# Patient Record
Sex: Female | Born: 1990 | Race: Black or African American | Hispanic: No | Marital: Single | Smoking: Never smoker
Health system: Southern US, Community
[De-identification: ages and names within clinical notes are randomized; demographics above are authoritative.]

---

## 2012-02-28 ENCOUNTER — Emergency Department (HOSPITAL_COMMUNITY)
Admission: EM | Admit: 2012-02-28 | Discharge: 2012-02-28 | Disposition: A | Discharge status: Home / Self Care | Payer: Medicaid Other | Attending: Emergency Medicine | Admitting: Emergency Medicine

## 2012-02-28 ENCOUNTER — Encounter (HOSPITAL_COMMUNITY): Payer: Self-pay | Admitting: Emergency Medicine

## 2012-02-28 DIAGNOSIS — B9689 Other specified bacterial agents as the cause of diseases classified elsewhere: Secondary | ICD-10-CM | POA: Insufficient documentation

## 2012-02-28 DIAGNOSIS — N390 Urinary tract infection, site not specified: Secondary | ICD-10-CM | POA: Insufficient documentation

## 2012-02-28 DIAGNOSIS — N760 Acute vaginitis: Secondary | ICD-10-CM | POA: Insufficient documentation

## 2012-02-28 DIAGNOSIS — A499 Bacterial infection, unspecified: Secondary | ICD-10-CM | POA: Insufficient documentation

## 2012-02-28 LAB — URINALYSIS, ROUTINE W REFLEX MICROSCOPIC
Glucose, UA: NEGATIVE mg/dL
Hgb urine dipstick: NEGATIVE
Protein, ur: NEGATIVE mg/dL

## 2012-02-28 LAB — WET PREP, GENITAL: Yeast Wet Prep HPF POC: NONE SEEN

## 2012-02-28 LAB — URINE MICROSCOPIC-ADD ON

## 2012-02-28 LAB — POCT PREGNANCY, URINE: Preg Test, Ur: NEGATIVE

## 2012-02-28 MED ORDER — SULFAMETHOXAZOLE-TRIMETHOPRIM 800-160 MG PO TABS: 1.0000 | ORAL_TABLET | Freq: Two times a day (BID) | ORAL | Status: AC

## 2012-02-28 NOTE — ED Notes (Signed)
Pt c/o yellow vaginal discharge x several days with some irritation and burning

## 2012-02-28 NOTE — ED Provider Notes (Signed)
History     CSN: [id number]  Arrival date & time 02/28/12  1126   First MD Initiated Contact with Patient 02/28/12 1544      Chief Complaint  Patient presents with  . Vaginal Discharge    (Consider location/radiation/quality/duration/timing/severity/associated sxs/prior treatment) Patient is a 21 y.o. female presenting with vaginal discharge. The history is provided by the patient. No language interpreter was used.  Vaginal Discharge This is a new problem. The current episode started in the past 7 days. The problem occurs intermittently. The problem has been gradually improving. Pertinent negatives include no rash or urinary symptoms. The symptoms are aggravated by intercourse. The treatment provided significant relief.  Patient states she thought she had a yeast infection, treated with monastat with improvement in symptoms.  Persistent vaginal irritation.  Sexual active, partner uses condoms.  History reviewed. No pertinent past medical history.  History reviewed. No pertinent past surgical history.  History reviewed. No pertinent family history.  History  Substance Use Topics  . Smoking status: Never Smoker   . Smokeless tobacco: Not on file  . Alcohol Use: Yes     occ    OB History    Grav Para Term Preterm Abortions TAB SAB Ect Mult Living                  Review of Systems  Genitourinary: Positive for vaginal discharge and vaginal pain. Negative for frequency, flank pain and vaginal bleeding.  Skin: Negative for rash.  All other systems reviewed and are negative.    Allergies  Review of patient's allergies indicates no known allergies.  Home Medications   Current Outpatient Rx  Name Route Sig Dispense Refill  . DIPHENHYDRAMINE HCL 12.5 MG/5ML PO ELIX Oral Take 25 mg by mouth 4 (four) times daily as needed. allergies    . OVER THE COUNTER MEDICATION Oral Take 1 tablet by mouth 2 (two) times daily as needed. Sinus medication for sinus congestion /cold    .  SULFAMETHOXAZOLE-TRIMETHOPRIM 800-160 MG PO TABS Oral Take 1 tablet by mouth 2 (two) times daily. 14 tablet 0    BP 110/77  Pulse 86  Temp 97.8 F (36.6 C) (Oral)  Resp 16  SpO2 100%  Physical Exam  Vitals reviewed. Constitutional: She is oriented to person, place, and time. She appears well-developed and well-nourished.  HENT:  Head: Normocephalic and atraumatic.  Eyes: Pupils are equal, round, and reactive to light.  Neck: Normal range of motion.  Cardiovascular: Normal rate and regular rhythm.   No murmur heard. Pulmonary/Chest: Effort normal and breath sounds normal.  Abdominal: Soft. There is no tenderness.  Genitourinary: There is no rash or lesion on the right labia. There is no rash or lesion on the left labia. There is tenderness around the vagina. No erythema or bleeding around the vagina. No vaginal discharge found.  Musculoskeletal: Normal range of motion.  Lymphadenopathy:    She has no cervical adenopathy.  Neurological: She is alert and oriented to person, place, and time.  Skin: Skin is warm and dry. No rash noted.  Psychiatric: She has a normal mood and affect. Her behavior is normal. Judgment and thought content normal.    ED Course  Procedures (including critical care time)  Labs Reviewed  URINALYSIS, ROUTINE W REFLEX MICROSCOPIC - Abnormal; Notable for the following:    Color, Urine AMBER (*)  BIOCHEMICALS MAY BE AFFECTED BY COLOR   APPearance CLOUDY (*)     Specific Gravity, Urine 1.033 (*)  Bilirubin Urine SMALL (*)     Leukocytes, UA LARGE (*)     All other components within normal limits  URINE MICROSCOPIC-ADD ON - Abnormal; Notable for the following:    Squamous Epithelial / LPF MANY (*)     Bacteria, UA MANY (*)     Crystals CA OXALATE CRYSTALS (*)     All other components within normal limits  POCT PREGNANCY, URINE  WET PREP, GENITAL  GC/CHLAMYDIA PROBE AMP, GENITAL  URINE CULTURE   No results found.   1. Urinary tract infection     2.  Bacterial vaginosis    MDM  Wet prep with clue cells.  Patient had left prior to return of lab results.  Contacted flow manager to notify patient of findings and provided prescription for flagyl.        Jimmye Norman, NP 02/28/12 [phone]

## 2012-02-29 LAB — GC/CHLAMYDIA PROBE AMP, GENITAL
Chlamydia, DNA Probe: NEGATIVE
GC Probe Amp, Genital: NEGATIVE

## 2012-02-29 NOTE — ED Provider Notes (Signed)
Medical screening examination/treatment/procedure(s) were performed by non-physician practitioner and as supervising physician I was immediately available for consultation/collaboration.   Deetya Drouillard M Tywana Robotham, MD 02/29/12 0053 

## 2012-03-04 NOTE — ED Notes (Addendum)
Per Felicie Morn NP, call patient and inform of BV. Rx: Flagyl 2 gms po x 1. Called patient and informed them of Bacterial Vaginosis and new Rx. Wants Rx called to [facility] on [address].

## 2012-03-05 LAB — URINE CULTURE

## 2012-11-26 ENCOUNTER — Emergency Department (HOSPITAL_COMMUNITY)
Admission: EM | Admit: 2012-11-26 | Discharge: 2012-11-27 | Disposition: A | Discharge status: Home / Self Care | Payer: Medicaid Other | Attending: Emergency Medicine | Admitting: Emergency Medicine

## 2012-11-26 ENCOUNTER — Encounter (HOSPITAL_COMMUNITY): Payer: Self-pay | Admitting: *Deleted

## 2012-11-26 DIAGNOSIS — R000 Tachycardia, unspecified: Secondary | ICD-10-CM | POA: Insufficient documentation

## 2012-11-26 DIAGNOSIS — N739 Female pelvic inflammatory disease, unspecified: Secondary | ICD-10-CM | POA: Insufficient documentation

## 2012-11-26 DIAGNOSIS — R51 Headache: Secondary | ICD-10-CM | POA: Insufficient documentation

## 2012-11-26 DIAGNOSIS — R300 Dysuria: Secondary | ICD-10-CM | POA: Insufficient documentation

## 2012-11-26 DIAGNOSIS — N730 Acute parametritis and pelvic cellulitis: Secondary | ICD-10-CM

## 2012-11-26 DIAGNOSIS — R350 Frequency of micturition: Secondary | ICD-10-CM | POA: Insufficient documentation

## 2012-11-26 DIAGNOSIS — N898 Other specified noninflammatory disorders of vagina: Secondary | ICD-10-CM | POA: Insufficient documentation

## 2012-11-26 DIAGNOSIS — R197 Diarrhea, unspecified: Secondary | ICD-10-CM | POA: Insufficient documentation

## 2012-11-26 DIAGNOSIS — Z3202 Encounter for pregnancy test, result negative: Secondary | ICD-10-CM | POA: Insufficient documentation

## 2012-11-26 DIAGNOSIS — R1110 Vomiting, unspecified: Secondary | ICD-10-CM | POA: Insufficient documentation

## 2012-11-26 DIAGNOSIS — N390 Urinary tract infection, site not specified: Secondary | ICD-10-CM | POA: Insufficient documentation

## 2012-11-26 LAB — COMPREHENSIVE METABOLIC PANEL
ALT: 99 U/L — ABNORMAL HIGH (ref 0–35)
CO2: 22 mEq/L (ref 19–32)
Calcium: 9.1 mg/dL (ref 8.4–10.5)
Creatinine, Ser: 0.7 mg/dL (ref 0.50–1.10)
GFR calc Af Amer: >90 mL/min (ref >90)
GFR calc non Af Amer: >90 mL/min (ref >90)
Glucose, Bld: 164 mg/dL — ABNORMAL HIGH (ref 70–99)
Sodium: 135 mEq/L (ref 135–145)

## 2012-11-26 LAB — WET PREP, GENITAL
Clue Cells Wet Prep HPF POC: NONE SEEN
Trich, Wet Prep: NONE SEEN

## 2012-11-26 LAB — CBC WITH DIFFERENTIAL/PLATELET
Eosinophils Relative: 0 % (ref 0–5)
HCT: 38.4 % (ref 36.0–46.0)
Hemoglobin: 13.9 g/dL (ref 12.0–15.0)
Lymphocytes Relative: 10 % — ABNORMAL LOW (ref 12–46)
Lymphs Abs: 0.7 10*3/uL (ref 0.7–4.0)
MCV: 79.3 fL (ref 78.0–100.0)
Monocytes Absolute: 0.7 10*3/uL (ref 0.1–1.0)
Monocytes Relative: 9 % (ref 3–12)
RBC: 4.84 MIL/uL (ref 3.87–5.11)
WBC: 7.6 10*3/uL (ref 4.0–10.5)

## 2012-11-26 LAB — URINALYSIS, ROUTINE W REFLEX MICROSCOPIC
Glucose, UA: NEGATIVE mg/dL
Specific Gravity, Urine: 1.031 — ABNORMAL HIGH (ref 1.005–1.030)

## 2012-11-26 LAB — URINE MICROSCOPIC-ADD ON

## 2012-11-26 MED ORDER — PROMETHAZINE HCL 25 MG/ML IJ SOLN
25.0000 mg | Freq: Once | INTRAMUSCULAR | Status: DC
  Filled 2012-11-26 (×2): qty 1

## 2012-11-26 MED ORDER — DOXYCYCLINE HYCLATE 100 MG PO TABS
100.0000 mg | ORAL_TABLET | Freq: Once | ORAL | Status: AC
  Administered 2012-11-26: 100 mg via ORAL
  Filled 2012-11-26: qty 1

## 2012-11-26 MED ORDER — METRONIDAZOLE 500 MG PO TABS
500.0000 mg | ORAL_TABLET | Freq: Once | ORAL | Status: AC
  Administered 2012-11-27: 500 mg via ORAL
  Filled 2012-11-26: qty 1

## 2012-11-26 MED ORDER — SODIUM CHLORIDE 0.9 % IV BOLUS (SEPSIS)
1000.0000 mL | Freq: Once | INTRAVENOUS | Status: AC
  Administered 2012-11-26: 1000 mL via INTRAVENOUS

## 2012-11-26 MED ORDER — ACETAMINOPHEN 500 MG PO TABS
1000.0000 mg | ORAL_TABLET | Freq: Once | ORAL | Status: AC
  Administered 2012-11-26: 1000 mg via ORAL
  Filled 2012-11-26: qty 2

## 2012-11-26 MED ORDER — IOHEXOL 300 MG/ML  SOLN
50.0000 mL | Freq: Once | INTRAMUSCULAR | Status: AC | PRN
  Administered 2012-11-26: 50 mL via ORAL

## 2012-11-26 MED ORDER — CEFTRIAXONE SODIUM 1 G IJ SOLR
1.0000 g | Freq: Once | INTRAMUSCULAR | Status: AC
  Administered 2012-11-26: 1 g via INTRAMUSCULAR
  Filled 2012-11-26: qty 10

## 2012-11-26 MED ORDER — ONDANSETRON 4 MG PO TBDP
8.0000 mg | ORAL_TABLET | Freq: Once | ORAL | Status: AC
  Administered 2012-11-26: 8 mg via ORAL
  Filled 2012-11-26: qty 2

## 2012-11-26 MED ORDER — AZITHROMYCIN 250 MG PO TABS
1000.0000 mg | ORAL_TABLET | Freq: Once | ORAL | Status: AC
  Administered 2012-11-26: 1000 mg via ORAL
  Filled 2012-11-26: qty 4

## 2012-11-26 MED ORDER — LIDOCAINE HCL (PF) 1 % IJ SOLN
INTRAMUSCULAR | Status: AC
  Administered 2012-11-26: 2.1 mL
  Filled 2012-11-26: qty 5

## 2012-11-26 NOTE — ED Notes (Signed)
Pt vomited X1.  

## 2012-11-26 NOTE — ED Notes (Signed)
Pt reports that it seems like every time after she has intercourse she gets a UTI and yeast infection. Pt sts she urinates after intercourse.

## 2012-11-26 NOTE — ED Notes (Signed)
Pt given water and ginger ale, intructed to try to finish the cup of water.

## 2012-11-26 NOTE — ED Notes (Signed)
Pt is here with left lower abdominal and left flank pain for three days, yellow vaginal discharge, and eye hurts with movement

## 2012-11-26 NOTE — ED Provider Notes (Signed)
History     CSN: [id number]  Arrival date & time 11/26/12  1739   First MD Initiated Contact with Patient 11/26/12 1919      Chief Complaint  Patient presents with  . Abdominal Pain  . Flank Pain  . Vaginal Discharge    (Consider location/radiation/quality/duration/timing/severity/associated sxs/prior treatment) HPI History provided by pt.   Pt has had increased urinary frequency and dysuria x 1 week.  Today she developed yellow vaginal discharge and had a single episode of vomiting and diarrhea as well.  Has had intermittent pain in her left side and an intermittent, non-traumatic, frontal headache.  No known fever and denies having any other GU sx, hematemesis/hematochezia/melena, vision changes or dizziness.  Periods have been regular.  Single sexual partner.  No prior h/o STDs.     History reviewed. No pertinent past medical history.  History reviewed. No pertinent past surgical history.  No family history on file.  History  Substance Use Topics  . Smoking status: Never Smoker   . Smokeless tobacco: Not on file  . Alcohol Use: Yes     Comment: occ    OB History   Grav Para Term Preterm Abortions TAB SAB Ect Mult Living                  Review of Systems  All other systems reviewed and are negative.    Allergies  Review of patient's allergies indicates no known allergies.  Home Medications   Current Outpatient Rx  Name  Route  Sig  Dispense  Refill  . Cranberry-Olive Leaf (URINARY TRACT HEALTH PO)   Oral   Take 2 tablets by mouth every 6 (six) hours as needed (for urinary tract pain).         . medroxyPROGESTERone (DEPO-PROVERA) 150 MG/ML injection   Intramuscular   Inject 150 mg into the muscle every 3 (three) months.           BP 117/70  Pulse 110  Temp(Src) 99.9 F (37.7 C) (Oral)  Resp 20  SpO2 99%  Physical Exam  Nursing note and vitals reviewed. Constitutional: She is oriented to person, place, and time. She appears well-developed and  well-nourished. No distress.  HENT:  Head: Normocephalic and atraumatic.  Eyes:  Normal appearance  Neck: Normal range of motion.  No meningeal signs  Cardiovascular: Regular rhythm.   tachycardic  Pulmonary/Chest: Effort normal and breath sounds normal. No respiratory distress.  Abdominal: Soft. Bowel sounds are normal. She exhibits no distension and no mass. There is no rebound and no guarding.  Mild suprapubic and diffuse lower abdominal ttp.   Genitourinary:  No CVA tenderness.  Nml external genitalia.  Large amt thin, greenish-yellow vaginal discharge.  Cervix closed, erythematous, friable.   Diffuse, severe tenderness on bimanual.   Musculoskeletal: Normal range of motion.  Neurological: She is alert and oriented to person, place, and time.  CN 3-12 intact.  No sensory deficits.  5/5 and equal upper and lower extremity strength.  No past pointing.   Skin: Skin is warm and dry. No rash noted.  Psychiatric: She has a normal mood and affect. Her behavior is normal.    ED Course  Procedures (including critical care time)  Labs Reviewed  WET PREP, GENITAL - Abnormal; Notable for the following:    WBC, Wet Prep HPF POC MANY (*)    All other components within normal limits  URINALYSIS, ROUTINE W REFLEX MICROSCOPIC - Abnormal; Notable for the following:  Color, Urine AMBER (*)    APPearance CLOUDY (*)    Specific Gravity, Urine 1.031 (*)    Hgb urine dipstick MODERATE (*)    Ketones, ur 15 (*)    Protein, ur 30 (*)    Nitrite POSITIVE (*)    Leukocytes, UA LARGE (*)    All other components within normal limits  CBC WITH DIFFERENTIAL - Abnormal; Notable for the following:    MCHC 36.2 (*)    Neutrophils Relative % 81 (*)    Lymphocytes Relative 10 (*)    All other components within normal limits  COMPREHENSIVE METABOLIC PANEL - Abnormal; Notable for the following:    Glucose, Bld 164 (*)    AST 100 (*)    ALT 99 (*)    Total Bilirubin 2.2 (*)    All other components  within normal limits  URINE MICROSCOPIC-ADD ON - Abnormal; Notable for the following:    Squamous Epithelial / LPF FEW (*)    Bacteria, UA FEW (*)    All other components within normal limits  URINE CULTURE  GC/CHLAMYDIA PROBE AMP  POCT PREGNANCY, URINE   Ct Abdomen Pelvis W Contrast  11/27/2012   *RADIOLOGY REPORT*  Clinical Data: Flank pain.  Vaginal discharge.  Evaluate for possible tubo-ovarian abscess.  CT ABDOMEN AND PELVIS WITH CONTRAST  Technique:  Multidetector CT imaging of the abdomen and pelvis was performed following the standard protocol during bolus administration of intravenous contrast.  Contrast:  100 ml of Omnipaque-300.  Comparison: No priors.  Findings:  Lung Bases: Unremarkable.  Abdomen/Pelvis:  The appearance of the liver, gallbladder, pancreas, spleen, bilateral adrenal glands and bilateral kidneys is unremarkable.  Normal appendix.  The uterus and ovaries are unremarkable in appearance.  Specifically, no findings to strongly suggest the presence of a tubo-ovarian abscess at this time.  No significant volume of ascites.  No pneumoperitoneum.  No pathologic distension of small bowel.  No definite pathologic lymphadenopathy identified within the abdomen or pelvis.  Urinary bladder is unremarkable in appearance.  Musculoskeletal: There are no aggressive appearing lytic or blastic lesions noted in the visualized portions of the skeleton.  IMPRESSION: 1.  No acute findings in the abdomen or pelvis to account for the patient's symptoms.  Specifically, no signs of a tubal ovarian abscess are identified at this time. 2.  Normal appendix.   Original Report Authenticated By: Trudie Reed, M.D.     1. PID (acute pelvic inflammatory disease)   2. UTI (lower urinary tract infection)       MDM  22yo healthy F presents w/ abd pain, dysuria, frequency and vaginal discharge.  Non-toxic appearing, borderline febrile, tachycardic, lower abd tenderness, green-yellow vaginal discharge +  friable cervix and diffuse, severe tenderness on bimanual exam.  Labs sig for UTI.  Pt likely has PID as well.  She has received IM rocephin, po zithromax, doxy and flagyl.   She has also had tylenol and zofran.  She is not tolerating fluids well because she "has a headache" and continues to be tachycardic, most recent HR 130.  Normotensive.  Discussed w/ Dr. Patria Mane who recommends CT abd/pelvis.  IVF and phenergan ordered.  Will reassess shortly.  11:17 PM   Vital signs have normalized and pt otherwise stable.  CT unremarkable.  Results discussed w/ patient.  D/c'd home w/ doxycycline, flagyl, keflex, vicodin and phenergan.  Referred to [facility]  Return precautions discussed.         Otilio Miu, PA-C  11/27/12 0613 

## 2012-11-26 NOTE — ED Notes (Signed)
Pt c/o UTI symptoms, sts she took some OTC meds and took it yesterday unsure what it was called. Pt reports she is very sore in vaginal area, has burning and itching. Pt reports seeing yellow vaginal d/c tried using monostat but still having d/c. Pt denies vaginal odor. Pt denies using protection with intercourse. Pt in nad, skin warm and dry, resp e/u.

## 2012-11-27 ENCOUNTER — Emergency Department (HOSPITAL_COMMUNITY): Payer: Medicaid Other

## 2012-11-27 MED ORDER — HYDROCODONE-ACETAMINOPHEN 5-325 MG PO TABS: 1.0000 | ORAL_TABLET | ORAL | Status: DC | PRN

## 2012-11-27 MED ORDER — METRONIDAZOLE 500 MG PO TABS: 500.0000 mg | ORAL_TABLET | Freq: Two times a day (BID) | ORAL | Status: DC

## 2012-11-27 MED ORDER — PROMETHAZINE HCL 25 MG PO TABS: 25.0000 mg | ORAL_TABLET | Freq: Four times a day (QID) | ORAL | Status: DC | PRN

## 2012-11-27 MED ORDER — PHENAZOPYRIDINE HCL 100 MG PO TABS
95.0000 mg | ORAL_TABLET | Freq: Once | ORAL | Status: AC
  Administered 2012-11-27: 100 mg via ORAL
  Filled 2012-11-27: qty 1

## 2012-11-27 MED ORDER — CEPHALEXIN 500 MG PO CAPS: 1000.0000 mg | ORAL_CAPSULE | Freq: Two times a day (BID) | ORAL | Status: DC

## 2012-11-27 MED ORDER — IOHEXOL 300 MG/ML  SOLN
100.0000 mL | Freq: Once | INTRAMUSCULAR | Status: AC | PRN
  Administered 2012-11-27: 100 mL via INTRAVENOUS

## 2012-11-27 MED ORDER — DOXYCYCLINE HYCLATE 100 MG PO CAPS: 100.0000 mg | ORAL_CAPSULE | Freq: Two times a day (BID) | ORAL | Status: DC

## 2012-11-27 NOTE — ED Notes (Signed)
Pt states she can not drink anymore contrast until she has something for her burning urination. RN informed pt that he would speak to her provider, and RN reinforced the importance of drinking the oral contrast for CT scan.

## 2012-11-27 NOTE — ED Notes (Signed)
CT paged. 

## 2012-11-27 NOTE — ED Notes (Signed)
Pt states she wishes to wait on taking the phenergan since she is not nauseous now.

## 2012-11-28 NOTE — ED Provider Notes (Signed)
Medical screening examination/treatment/procedure(s) were performed by non-physician practitioner and as supervising physician I was immediately available for consultation/collaboration.   Lyanne Co, MD 11/28/12 [phone]

## 2012-11-30 ENCOUNTER — Emergency Department (HOSPITAL_COMMUNITY)
Admission: EM | Admit: 2012-11-30 | Discharge: 2012-11-30 | Disposition: A | Discharge status: Home / Self Care | Payer: Medicaid Other | Attending: Emergency Medicine | Admitting: Emergency Medicine

## 2012-11-30 DIAGNOSIS — N342 Other urethritis: Secondary | ICD-10-CM | POA: Insufficient documentation

## 2012-11-30 DIAGNOSIS — R350 Frequency of micturition: Secondary | ICD-10-CM | POA: Insufficient documentation

## 2012-11-30 DIAGNOSIS — R109 Unspecified abdominal pain: Secondary | ICD-10-CM | POA: Insufficient documentation

## 2012-11-30 DIAGNOSIS — N898 Other specified noninflammatory disorders of vagina: Secondary | ICD-10-CM | POA: Insufficient documentation

## 2012-11-30 LAB — URINE CULTURE: Colony Count: 35000

## 2012-11-30 LAB — CBC
MCV: 79.4 fL (ref 78.0–100.0)
Platelets: 154 10*3/uL (ref 150–400)
RDW: 12.1 % (ref 11.5–15.5)
WBC: 4.2 10*3/uL (ref 4.0–10.5)

## 2012-11-30 LAB — URINE MICROSCOPIC-ADD ON

## 2012-11-30 LAB — URINALYSIS, ROUTINE W REFLEX MICROSCOPIC
Hgb urine dipstick: NEGATIVE
Nitrite: POSITIVE — AB
Protein, ur: 30 mg/dL — AB
Urobilinogen, UA: 0.2 mg/dL (ref 0.0–1.0)

## 2012-11-30 LAB — POCT I-STAT, CHEM 8
Calcium, Ion: 1.11 mmol/L — ABNORMAL LOW (ref 1.12–1.23)
Chloride: 102 mEq/L (ref 96–112)
HCT: 39 % (ref 36.0–46.0)
Sodium: 137 mEq/L (ref 135–145)
TCO2: 25 mmol/L (ref 0–100)

## 2012-11-30 MED ORDER — KETOROLAC TROMETHAMINE 30 MG/ML IJ SOLN
30.0000 mg | Freq: Once | INTRAMUSCULAR | Status: AC
  Administered 2012-11-30: 30 mg via INTRAVENOUS
  Filled 2012-11-30: qty 1

## 2012-11-30 MED ORDER — PHENAZOPYRIDINE HCL 200 MG PO TABS: 200.0000 mg | ORAL_TABLET | Freq: Three times a day (TID) | ORAL | Status: DC

## 2012-11-30 MED ORDER — HYDROCODONE-ACETAMINOPHEN 5-325 MG PO TABS: 2.0000 | ORAL_TABLET | ORAL | Status: DC | PRN

## 2012-11-30 MED ORDER — ACETAMINOPHEN 325 MG PO TABS: 650.0000 mg | ORAL_TABLET | Freq: Once | ORAL | Status: AC

## 2012-11-30 MED ORDER — MORPHINE SULFATE 4 MG/ML IJ SOLN
4.0000 mg | Freq: Once | INTRAMUSCULAR | Status: AC
  Administered 2012-11-30: 4 mg via INTRAVENOUS
  Filled 2012-11-30: qty 1

## 2012-11-30 MED ORDER — PHENAZOPYRIDINE HCL 100 MG PO TABS
95.0000 mg | ORAL_TABLET | Freq: Once | ORAL | Status: AC
  Administered 2012-11-30: 100 mg via ORAL
  Filled 2012-11-30: qty 1

## 2012-11-30 MED ORDER — SODIUM CHLORIDE 0.9 % IV BOLUS (SEPSIS)
1000.0000 mL | Freq: Once | INTRAVENOUS | Status: AC
  Administered 2012-11-30: 1000 mL via INTRAVENOUS

## 2012-11-30 MED ORDER — ACYCLOVIR 400 MG PO TABS: 800.0000 mg | ORAL_TABLET | Freq: Every day | ORAL | Status: DC

## 2012-11-30 MED ORDER — ACETAMINOPHEN 325 MG PO TABS
650.0000 mg | ORAL_TABLET | Freq: Once | ORAL | Status: AC
  Administered 2012-11-30: 650 mg via ORAL
  Filled 2012-11-30: qty 2

## 2012-11-30 NOTE — ED Provider Notes (Signed)
Medical screening examination/treatment/procedure(s) were performed by non-physician practitioner and as supervising physician I was immediately available for consultation/collaboration.  Anaisabel Pederson, MD 11/30/12 2135 

## 2012-11-30 NOTE — ED Provider Notes (Signed)
History     CSN: [id number]  Arrival date & time 11/30/12  0204   First MD Initiated Contact with Patient 11/30/12 0406      No chief complaint on file.   (Consider location/radiation/quality/duration/timing/severity/associated sxs/prior treatment) HPI History provided by pt.   Pt presents to ED for second time in 3 days w/ dysuria, frequency, yellow vaginal discharge and diffuse lower abd pain.  Was diagnosed w/ clinical PID as well as UTI at last visit, treated in ED w/ zithromax and rocephin, and d/c'd home w/ doxy, flagyl, keflex, vicodin and zofran.  Has been taking all as prescribed, but symptoms have worsened.  Abd pain has increased and is now radiating around to lower back.  She has lost her appetite and feels generally weak. She tries to avoid urinating because the burning is so severe.  No known fever.   No past medical history on file.  No past surgical history on file.  No family history on file.  History  Substance Use Topics  . Smoking status: Never Smoker   . Smokeless tobacco: Not on file  . Alcohol Use: Yes     Comment: occ    OB History   Grav Para Term Preterm Abortions TAB SAB Ect Mult Living                  Review of Systems  All other systems reviewed and are negative.    Allergies  Review of patient's allergies indicates no known allergies.  Home Medications   Current Outpatient Rx  Name  Route  Sig  Dispense  Refill  . cephALEXin (KEFLEX) 500 MG capsule   Oral   Take 2 capsules (1,000 mg total) by mouth 2 (two) times daily.   28 capsule   0   . dimenhyDRINATE (DRAMAMINE) 50 MG tablet   Oral   Take 100 mg by mouth every 8 (eight) hours as needed (nausea).         . doxycycline (VIBRAMYCIN) 100 MG capsule   Oral   Take 1 capsule (100 mg total) by mouth 2 (two) times daily.   19 capsule   0   . HYDROcodone-acetaminophen (NORCO/VICODIN) 5-325 MG per tablet   Oral   Take 1 tablet by mouth every 4 (four) hours as needed for pain.   20 tablet   0   . medroxyPROGESTERone (DEPO-PROVERA) 150 MG/ML injection   Intramuscular   Inject 150 mg into the muscle every 3 (three) months.         . metroNIDAZOLE (FLAGYL) 500 MG tablet   Oral   Take 1 tablet (500 mg total) by mouth 2 (two) times daily.   14 tablet   0     BP 116/79  Pulse 110  Temp(Src) 101.6 F (38.7 C) (Oral)  Resp 16  SpO2 95%  LMP 11/30/2012  Physical Exam  Nursing note and vitals reviewed. Constitutional: She is oriented to person, place, and time. She appears well-developed and well-nourished. No distress.  HENT:  Head: Normocephalic and atraumatic.  Eyes:  Normal appearance  Neck: Normal range of motion.  Cardiovascular: Regular rhythm.   tachycardic  Pulmonary/Chest: Effort normal and breath sounds normal. No respiratory distress.  Abdominal: Soft. Bowel sounds are normal. She exhibits no distension and no mass. There is no rebound and no guarding.  abd diffusely tender, but worst mid-line lower abd and LLQ  Genitourinary:  No CVA tenderness.  Pt refuses repeat pelvic examination.  Musculoskeletal: Normal range of  motion.  Neurological: She is alert and oriented to person, place, and time.  Skin: Skin is warm and dry. No rash noted.  Psychiatric: She has a normal mood and affect. Her behavior is normal.    ED Course  Procedures (including critical care time)  Labs Reviewed  URINALYSIS, ROUTINE W REFLEX MICROSCOPIC - Abnormal; Notable for the following:    Color, Urine AMBER (*)    APPearance CLOUDY (*)    Specific Gravity, Urine 1.031 (*)    Glucose, UA 100 (*)    Ketones, ur 40 (*)    Protein, ur 30 (*)    Nitrite POSITIVE (*)    Leukocytes, UA MODERATE (*)    All other components within normal limits  CBC - Abnormal; Notable for the following:    MCHC 36.8 (*)    All other components within normal limits  URINE MICROSCOPIC-ADD ON - Abnormal; Notable for the following:    Squamous Epithelial / LPF MANY (*)    Bacteria,  UA FEW (*)    All other components within normal limits  POCT I-STAT, CHEM 8 - Abnormal; Notable for the following:    Glucose, Bld 163 (*)    Calcium, Ion 1.11 (*)    All other components within normal limits  URINE CULTURE   No results found.   No diagnosis found.    MDM  22yo formerly healthy F, diagnosed w/ clinical PID and UTI in ED 3 days ago, returns to ER with worsening sx, despite compliance w/ abx.  On exam, febrile, tachycardic, abd soft/non-distended, diffuse ttp, particularly mid-line lower abd and LLQ.  Pt refuses repeat pelvic examination.  Labs unremarkable w/ exception of U/A which shows persistent infection.  She is receiving IVF.  Morphine ordered for pain and tylenol for fever.  Dr. Hyacinth Meeker to resume care.  7:10 AM         Otilio Miu, PA-C 11/30/12 [phone]

## 2012-11-30 NOTE — ED Notes (Signed)
Here on 11/26/12 for pid.  Prescribed flagyl, doxycycline, and Vicodin. Nothing is working. No appetite.  Increase pain with urination.

## 2012-11-30 NOTE — ED Provider Notes (Signed)
22 year old female who presents with approximately one week of ongoing symptoms including vaginal discharge, dysuria with urination which is described as a burning, has been fairly consistent over the last week, nothing seems to make it better or worse and is associated with mild fevers and vaginal discharge.  Labs show that the patient had 35,000 colony-forming units which were grown as 2 different strains, E. coli and Proteus. It is sensitive to Keflex which the patient has been taking. Gonorrhea and Chlamydia tested negative.  On my exam the patient has several discrete erythematous and early ulcerative lesions around the introitus and around the urethra which could be consistent with herpes. I have treated her with acyclovir, she has refused an internal exam, she states her understanding that I cannot give her a full diagnosis without a full exam but is willing to followup as an outpatient.  At this time I do not feel that the patient requires admission to the hospital, reviewed the CT scan shows no signs of intraoral 3 times a day, tubo-ovarian abscess or renal abscess, there is no leukocytosis and her fever has defervesced. She is not tachycardic, she is well appearing, she is taking oral fluids and will be treated with multiple medications for symptomatically and followup with the STD clinic.   Meds given in ED:  Medications  acetaminophen (TYLENOL) tablet 650 mg (0 mg Oral Duplicate 11/30/12 0506)  acetaminophen (TYLENOL) tablet 650 mg (650 mg Oral Given 11/30/12 0522)  morphine 4 MG/ML injection 4 mg (4 mg Intravenous Given 11/30/12 0524)  sodium chloride 0.9 % bolus 1,000 mL (0 mLs Intravenous Stopped 11/30/12 0625)  ketorolac (TORADOL) 30 MG/ML injection 30 mg (30 mg Intravenous Given 11/30/12 0525)  morphine 4 MG/ML injection 4 mg (4 mg Intravenous Given 11/30/12 0732)  phenazopyridine (PYRIDIUM) tablet 100 mg (100 mg Oral Given 11/30/12 0732)    New Prescriptions   ACYCLOVIR (ZOVIRAX) 400 MG  TABLET    Take 2 tablets (800 mg total) by mouth 5 (five) times daily.   HYDROCODONE-ACETAMINOPHEN (NORCO/VICODIN) 5-325 MG PER TABLET    Take 2 tablets by mouth every 4 (four) hours as needed for pain.   PHENAZOPYRIDINE (PYRIDIUM) 200 MG TABLET    Take 1 tablet (200 mg total) by mouth 3 (three) times daily.     Medical screening examination/treatment/procedure(s) were conducted as a shared visit with non-physician practitioner(s) and myself.  I personally evaluated the patient during the encounter    Vida Roller, MD 11/30/12 [phone]

## 2012-12-01 ENCOUNTER — Telehealth (HOSPITAL_COMMUNITY): Payer: Self-pay | Admitting: Emergency Medicine

## 2012-12-01 LAB — URINE CULTURE: Colony Count: NO GROWTH

## 2012-12-01 NOTE — ED Notes (Signed)
Post ED Visit - Positive Culture Follow-up  Culture report reviewed by antimicrobial stewardship pharmacist: [x]  Wes Dulaney, Pharm.D., BCPS []  Celedonio Miyamoto, Pharm.D., BCPS []  Georgina Pillion, [address].D., BCPS []  [place], [address].D., BCPS, AAHIVP []  Estella Husk, Pharm.D., BCPS, AAHIVP  Positive urine culture Treated with Keflex, organism sensitive to the same and no further patient follow-up is required at this time.  Kylie A Holland 12/01/2012, 4:21 PM

## 2012-12-02 ENCOUNTER — Other Ambulatory Visit: Payer: Self-pay | Admitting: Medical

## 2012-12-02 ENCOUNTER — Encounter (HOSPITAL_COMMUNITY): Payer: Self-pay

## 2012-12-02 ENCOUNTER — Inpatient Hospital Stay (HOSPITAL_COMMUNITY)
Admission: AD | Admit: 2012-12-02 | Discharge: 2012-12-02 | Disposition: A | Discharge status: Home / Self Care | Payer: Medicaid Other | Source: Ambulatory Visit | Attending: Obstetrics & Gynecology | Admitting: Obstetrics & Gynecology

## 2012-12-02 ENCOUNTER — Emergency Department (HOSPITAL_COMMUNITY)
Admission: EM | Admit: 2012-12-02 | Discharge: 2012-12-02 | Disposition: A | Discharge status: Home / Self Care | Payer: Medicaid Other | Source: Home / Self Care | Attending: Emergency Medicine | Admitting: Emergency Medicine

## 2012-12-02 DIAGNOSIS — N7689 Other specified inflammation of vagina and vulva: Secondary | ICD-10-CM | POA: Insufficient documentation

## 2012-12-02 DIAGNOSIS — N9489 Other specified conditions associated with female genital organs and menstrual cycle: Secondary | ICD-10-CM | POA: Insufficient documentation

## 2012-12-02 DIAGNOSIS — N766 Ulceration of vulva: Secondary | ICD-10-CM

## 2012-12-02 DIAGNOSIS — A6000 Herpesviral infection of urogenital system, unspecified: Secondary | ICD-10-CM | POA: Insufficient documentation

## 2012-12-02 DIAGNOSIS — R300 Dysuria: Secondary | ICD-10-CM | POA: Insufficient documentation

## 2012-12-02 DIAGNOSIS — A6009 Herpesviral infection of other urogenital tract: Secondary | ICD-10-CM

## 2012-12-02 DIAGNOSIS — Z79899 Other long term (current) drug therapy: Secondary | ICD-10-CM | POA: Insufficient documentation

## 2012-12-02 DIAGNOSIS — N949 Unspecified condition associated with female genital organs and menstrual cycle: Secondary | ICD-10-CM | POA: Insufficient documentation

## 2012-12-02 DIAGNOSIS — N72 Inflammatory disease of cervix uteri: Secondary | ICD-10-CM | POA: Insufficient documentation

## 2012-12-02 DIAGNOSIS — N898 Other specified noninflammatory disorders of vagina: Secondary | ICD-10-CM

## 2012-12-02 LAB — WET PREP, GENITAL
Clue Cells Wet Prep HPF POC: NONE SEEN
Yeast Wet Prep HPF POC: NONE SEEN

## 2012-12-02 LAB — RPR: RPR Ser Ql: NONREACTIVE

## 2012-12-02 MED ORDER — LIDOCAINE HCL 2 % EX GEL
Freq: Once | CUTANEOUS | Status: AC
  Administered 2012-12-02: 5 via TOPICAL
  Filled 2012-12-02: qty 20

## 2012-12-02 MED ORDER — LIDOCAINE HCL 2 % EX GEL: CUTANEOUS | Status: DC | PRN

## 2012-12-02 NOTE — MAU Provider Note (Signed)
History     CSN: [id number]  Arrival date and time: 12/02/12 6578   None     No chief complaint on file.  HPI  Pt arrived here after being seen at the [facility] ED with the following HPI:  This is a 22 year old female with her third ED visit for pelvic pain in a week. She was seen on June 2 and diagnosed with PID. She was treated with Flagyl, Rocephin, doxycycline, Phenergan and Norco. She was also noted to have a urinary tract infection and was treated with Keflex. A CT scan at that time was unremarkable.  She was seen again on June 6 as though she refused a full pelvic exam and external exam showed vulvar lesions suspicious for herpes. She was treated with Zovirax and it was recommended she followup as an outpatient.  She is here complaining of severe pain in her vulva. She states the pain is external not deep in her pelvis. It is exacerbated by urinating, walking or making any movement that causes pressure on her vulva. There is associated purulent discharge. She is no longer having a fever. She has not had any vaginal bleeding. She has noticed some blood with urination. None of the medications she is taking has helped.   Pt denies that the lesions started with any tingling or small blisters.  No known history of herpes in the past.  Pt sent here for evaluation of lesions due to ulcers not appearing as typical herpetic lesions.     No past medical history on file.  No past surgical history on file.  No family history on file.  History  Substance Use Topics  . Smoking status: Never Smoker   . Smokeless tobacco: Not on file  . Alcohol Use: Yes     Comment: occ    Allergies: No Known Allergies  Prescriptions prior to admission  Medication Sig Dispense Refill  . acyclovir (ZOVIRAX) 400 MG tablet Take 2 tablets (800 mg total) by mouth 5 (five) times daily.  50 tablet  0  . cephALEXin (KEFLEX) 500 MG capsule Take 2 capsules (1,000 mg total) by mouth 2 (two) times daily.  28 capsule   0  . dimenhyDRINATE (DRAMAMINE) 50 MG tablet Take 100 mg by mouth every 8 (eight) hours as needed (nausea).      . doxycycline (VIBRAMYCIN) 100 MG capsule Take 1 capsule (100 mg total) by mouth 2 (two) times daily.  19 capsule  0  . HYDROcodone-acetaminophen (NORCO/VICODIN) 5-325 MG per tablet Take 2 tablets by mouth every 4 (four) hours as needed for pain.  10 tablet  0  . metroNIDAZOLE (FLAGYL) 500 MG tablet Take 1 tablet (500 mg total) by mouth 2 (two) times daily.  14 tablet  0  . phenazopyridine (PYRIDIUM) 200 MG tablet Take 1 tablet (200 mg total) by mouth 3 (three) times daily.  6 tablet  0  . medroxyPROGESTERone (DEPO-PROVERA) 150 MG/ML injection Inject 150 mg into the muscle every 3 (three) months.        Review of Systems  Genitourinary: Positive for dysuria.       Vaginal lesions and pain  All other systems reviewed and are negative.   Physical Exam   Blood pressure 109/80, pulse 100, temperature 98.2 F (36.8 C), resp. rate 18, height 5\' 6"  (1.676 m), weight 56.246 kg (124 lb), last menstrual period 11/30/2012.  Physical Exam  Constitutional: She is oriented to person, place, and time. She appears well-developed and well-nourished. No distress.  HENT:  Head: Normocephalic.  Neck: Normal range of motion. Neck supple.  GI: Soft. There is no tenderness.  Genitourinary:    There is tenderness around the vagina.  Musculoskeletal: Normal range of motion.  Neurological: She is alert and oriented to person, place, and time. She has normal reflexes.  Skin: Skin is warm and dry.    MAU Course  Procedures Results for orders placed during the hospital encounter of 12/02/12 (from the past 24 hour(s))  WET PREP, GENITAL     Status: Abnormal   Collection Time    12/02/12  6:58 AM      Result Value Range   Yeast Wet Prep HPF POC NONE SEEN  NONE SEEN   Trich, Wet Prep NONE SEEN  NONE SEEN   Clue Cells Wet Prep HPF POC NONE SEEN  NONE SEEN   WBC, Wet Prep HPF POC FEW (*) NONE  SEEN    HSV I&II serum test obtained as well as culture. RPR testing  Dr. 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

## 2012-12-02 NOTE — MAU Note (Signed)
Sent from [facility] for further evaluation of vaginal irritation.

## 2012-12-02 NOTE — Progress Notes (Signed)
Patient called requesting Rx for lidocaine gel. She was given sample in MAU this morning. Rx sent to patient's pharmacy with 2 refills.   Freddi Starr, PA-C 12/02/2012 5:33 PM

## 2012-12-02 NOTE — ED Provider Notes (Signed)
History     CSN: [id number]  Arrival date & time 12/02/12  0424   First MD Initiated Contact with Patient 12/02/12 0500      Chief Complaint  Patient presents with  . Pelvic Pain    (Consider location/radiation/quality/duration/timing/severity/associated sxs/prior treatment) HPI This is a 22 year old female with her third ED visit for pelvic pain in a week. She was seen on June 2 and diagnosed with PID. She was treated with Flagyl, Rocephin, doxycycline, Phenergan and Norco. She was also noted to have a urinary tract infection and was treated with Keflex. A CT scan at that time was unremarkable.  She was seen again on June 6 as though she refused a full pelvic exam and external exam showed vulvar lesions suspicious for herpes. She was treated with Zovirax and it was recommended she followup as an outpatient.  She is here complaining of severe pain in her vulva. She states the pain is external not deep in her pelvis. It is exacerbated by urinating, walking or making any movement that causes pressure on her vulva. There is associated purulent discharge. She is no longer having a fever. She has not had any vaginal bleeding. She has noticed some blood with urination. None of the medications she is taking has helped.  History reviewed. No pertinent past medical history.  History reviewed. No pertinent past surgical history.  No family history on file.  History  Substance Use Topics  . Smoking status: Never Smoker   . Smokeless tobacco: Not on file  . Alcohol Use: Yes     Comment: occ    OB History   Grav Para Term Preterm Abortions TAB SAB Ect Mult Living                  Review of Systems  All other systems reviewed and are negative.    Allergies  Review of patient's allergies indicates no known allergies.  Home Medications   Current Outpatient Rx  Name  Route  Sig  Dispense  Refill  . acyclovir (ZOVIRAX) 400 MG tablet   Oral   Take 2 tablets (800 mg total) by mouth  5 (five) times daily.   50 tablet   0   . cephALEXin (KEFLEX) 500 MG capsule   Oral   Take 2 capsules (1,000 mg total) by mouth 2 (two) times daily.   28 capsule   0   . dimenhyDRINATE (DRAMAMINE) 50 MG tablet   Oral   Take 100 mg by mouth every 8 (eight) hours as needed (nausea).         . doxycycline (VIBRAMYCIN) 100 MG capsule   Oral   Take 1 capsule (100 mg total) by mouth 2 (two) times daily.   19 capsule   0   . HYDROcodone-acetaminophen (NORCO/VICODIN) 5-325 MG per tablet   Oral   Take 2 tablets by mouth every 4 (four) hours as needed for pain.   10 tablet   0   . medroxyPROGESTERone (DEPO-PROVERA) 150 MG/ML injection   Intramuscular   Inject 150 mg into the muscle every 3 (three) months.         . metroNIDAZOLE (FLAGYL) 500 MG tablet   Oral   Take 1 tablet (500 mg total) by mouth 2 (two) times daily.   14 tablet   0   . phenazopyridine (PYRIDIUM) 200 MG tablet   Oral   Take 1 tablet (200 mg total) by mouth 3 (three) times daily.   6 tablet  0     BP 119/81  Pulse 106  Temp(Src) 98.7 F (37.1 C) (Oral)  Resp 20  Ht 5\' 6"  (1.676 m)  Wt 125 lb (56.7 kg)  BMI 20.19 kg/m2  SpO2 98%  LMP 11/30/2012  Physical Exam General: Well-developed, well-nourished female in no acute distress; appearance consistent with age of record HENT: normocephalic, atraumatic Eyes: pupils equal round and reactive to light; extraocular muscles intact Neck: supple Heart: regular rate and rhythm Lungs: clear to auscultation bilaterally Abdomen: soft; nondistended; mild suprapubic tenderness; no masses or hepatosplenomegaly; bowel sounds present GU: Multiple, almost contiguous, ulcerations of the vulvar mucosa with mucopurulent discharge and significant tenderness; patient unable to tolerate it internal exam. Extremities: No deformity; full range of motion; pulses normal Neurologic: Awake, alert and oriented; motor function intact in all extremities and symmetric; no  facial droop Skin: Warm and dry Psychiatric: Normal mood and affect     ED Course  Procedures (including critical care time)     MDM  D/W Sherryle Lis Muhummed, CNW at [facility] MAU, who will see the patient this morning.        Hanley Seamen, MD 12/02/12 [phone]

## 2012-12-02 NOTE — ED Notes (Signed)
Pt to ed c/o pelvic pain.  Has been seen here x 3 this week for same.  Was diag with PID and dcd home to f/u with obgyn but sts she has not done so.  C/o that the meds she was given here are not helping her.

## 2012-12-02 NOTE — ED Notes (Signed)
The patient is AOx4 and comfortable with her discharge instructions. 

## 2012-12-05 LAB — HERPES SIMPLEX VIRUS CULTURE

## 2013-03-15 ENCOUNTER — Emergency Department (HOSPITAL_COMMUNITY): Payer: Medicaid Other

## 2013-03-15 ENCOUNTER — Encounter (HOSPITAL_COMMUNITY): Payer: Self-pay | Admitting: Emergency Medicine

## 2013-03-15 ENCOUNTER — Emergency Department (HOSPITAL_COMMUNITY)
Admission: EM | Admit: 2013-03-15 | Discharge: 2013-03-15 | Disposition: A | Discharge status: Home / Self Care | Payer: Medicaid Other | Attending: Emergency Medicine | Admitting: Emergency Medicine

## 2013-03-15 DIAGNOSIS — R1084 Generalized abdominal pain: Secondary | ICD-10-CM | POA: Insufficient documentation

## 2013-03-15 DIAGNOSIS — R112 Nausea with vomiting, unspecified: Secondary | ICD-10-CM | POA: Insufficient documentation

## 2013-03-15 DIAGNOSIS — R109 Unspecified abdominal pain: Secondary | ICD-10-CM

## 2013-03-15 DIAGNOSIS — K5900 Constipation, unspecified: Secondary | ICD-10-CM | POA: Insufficient documentation

## 2013-03-15 DIAGNOSIS — Z3202 Encounter for pregnancy test, result negative: Secondary | ICD-10-CM | POA: Insufficient documentation

## 2013-03-15 LAB — COMPREHENSIVE METABOLIC PANEL
ALT: 43 U/L — ABNORMAL HIGH (ref 0–35)
AST: 41 U/L — ABNORMAL HIGH (ref 0–37)
Albumin: 4 g/dL (ref 3.5–5.2)
CO2: 26 mEq/L (ref 19–32)
Chloride: 101 mEq/L (ref 96–112)
Creatinine, Ser: 0.75 mg/dL (ref 0.50–1.10)
GFR calc non Af Amer: >90 mL/min (ref >90)
Potassium: 3.9 mEq/L (ref 3.5–5.1)
Sodium: 136 mEq/L (ref 135–145)
Total Bilirubin: 1.3 mg/dL — ABNORMAL HIGH (ref 0.3–1.2)

## 2013-03-15 LAB — CBC
Platelets: 207 10*3/uL (ref 150–400)
RBC: 4.98 MIL/uL (ref 3.87–5.11)
RDW: 12.1 % (ref 11.5–15.5)
WBC: 4.7 10*3/uL (ref 4.0–10.5)

## 2013-03-15 LAB — HCG, SERUM, QUALITATIVE: Preg, Serum: NEGATIVE

## 2013-03-15 LAB — URINALYSIS, ROUTINE W REFLEX MICROSCOPIC
Bilirubin Urine: NEGATIVE
Nitrite: NEGATIVE
Specific Gravity, Urine: 1.025 (ref 1.005–1.030)
Urobilinogen, UA: 1 mg/dL (ref 0.0–1.0)
pH: 6.5 (ref 5.0–8.0)

## 2013-03-15 LAB — PREGNANCY, URINE: Preg Test, Ur: NEGATIVE

## 2013-03-15 LAB — URINE MICROSCOPIC-ADD ON

## 2013-03-15 MED ORDER — DOCUSATE SODIUM 100 MG PO CAPS: 100.0000 mg | ORAL_CAPSULE | Freq: Two times a day (BID) | ORAL | Status: DC

## 2013-03-15 MED ORDER — ONDANSETRON 8 MG PO TBDP: 8.0000 mg | ORAL_TABLET | Freq: Three times a day (TID) | ORAL | Status: DC | PRN

## 2013-03-15 NOTE — ED Provider Notes (Signed)
CSN: [id number]     Arrival date & time 03/15/13  1249 History   First MD Initiated Contact with Patient 03/15/13 1258     Chief Complaint  Patient presents with  . Abdominal Pain   HPI Patient complains of intermittent abdominal pain that lasts 5 or 6 minutes followed by nausea with occasional episodes of vomiting.  This is been occurring over the past 10-14 days.  She also reports some constipation over the past week.  She states she felt like she had several large bowel movements yesterday which have improved her symptoms.  She denies urinary symptoms.  She has abnormal vaginal bleeding at baseline.  She denies any hematemesis.  No melena or hematochezia. Her symptoms are mild in severity.  Currently she is without any pain.  She reports her pain is generalized her entire abdomen.  No history of gallstones    History reviewed. No pertinent past medical history. History reviewed. No pertinent past surgical history. History reviewed. No pertinent family history. History  Substance Use Topics  . Smoking status: Never Smoker   . Smokeless tobacco: Not on file  . Alcohol Use: Yes     Comment: occ   OB History   Grav Para Term Preterm Abortions TAB SAB Ect Mult Living                 Review of Systems  All other systems reviewed and are negative.    Allergies  Review of patient's allergies indicates no known allergies.  Home Medications   Current Outpatient Rx  Name  Route  Sig  Dispense  Refill  . valACYclovir (VALTREX) 1000 MG tablet   Oral   Take 1,000 mg by mouth.         . docusate sodium (COLACE) 100 MG capsule   Oral   Take 1 capsule (100 mg total) by mouth every 12 (twelve) hours.   60 capsule   0   . medroxyPROGESTERone (DEPO-PROVERA) 150 MG/ML injection   Intramuscular   Inject 150 mg into the muscle every 3 (three) months.         . ondansetron (ZOFRAN ODT) 8 MG disintegrating tablet   Oral   Take 1 tablet (8 mg total) by mouth every 8 (eight) hours as  needed for nausea.   10 tablet   0    BP 121/89  Pulse 86  Temp(Src) 99.2 F (37.3 C) (Oral)  Resp 16  SpO2 100% Physical Exam  Nursing note and vitals reviewed. Constitutional: She is oriented to person, place, and time. She appears well-developed and well-nourished. No distress.  HENT:  Head: Normocephalic and atraumatic.  Eyes: EOM are normal.  Neck: Normal range of motion.  Cardiovascular: Normal rate, regular rhythm and normal heart sounds.   Pulmonary/Chest: Effort normal and breath sounds normal.  Abdominal: Soft. She exhibits no distension. There is no tenderness.  Musculoskeletal: Normal range of motion.  Neurological: She is alert and oriented to person, place, and time.  Skin: Skin is warm and dry.  Psychiatric: She has a normal mood and affect. Judgment normal.    ED Course  Procedures (including critical care time) Labs Review Labs Reviewed  URINALYSIS, ROUTINE W REFLEX MICROSCOPIC - Abnormal; Notable for the following:    APPearance CLOUDY (*)    Leukocytes, UA SMALL (*)    All other components within normal limits  CBC - Abnormal; Notable for the following:    MCHC 37.3 (*)    All other components within  normal limits  COMPREHENSIVE METABOLIC PANEL - Abnormal; Notable for the following:    AST 41 (*)    ALT 43 (*)    Total Bilirubin 1.3 (*)    All other components within normal limits  URINE MICROSCOPIC-ADD ON - Abnormal; Notable for the following:    Squamous Epithelial / LPF FEW (*)    Bacteria, UA FEW (*)    All other components within normal limits  PREGNANCY, URINE  LIPASE, BLOOD  HCG, SERUM, QUALITATIVE   Imaging Review US Abdomen Complete  03/15/2013   CLINICAL DATA:  Abdominal pain. Nausea.  EXAM: ABDOMEN ULTRASOUND  COMPARISON:  11/27/2012 CT. No comparison ultrasound.  FINDINGS: Gallbladder  No gallstones or wall thickening. Negative sonographic Murphy's sign.  Common bile duct  Diameter: 1.4 mm  Liver  No focal lesion identified. Within  normal limits in parenchymal echogenicity.  IVC  No abnormality visualized.  Pancreas  Poorly delineated secondary to overlying bowel gas.  Spleen  Size and appearance within normal limits.  Right Kidney  Length: 11.4 cm. Echogenicity within normal limits. No mass or hydronephrosis visualized.  Left Kidney  Length: 11.6 cm. Echogenicity within normal limits. No mass or hydronephrosis visualized.  Abdominal aorta  No aneurysm visualized.  IMPRESSION: Pancreas not well visualized secondary to overlying bowel gas otherwise negative exam.   Electronically Signed   By: Bridgett Larsson   On: 03/15/2013 14:31   I personally reviewed the imaging tests through PACS system I reviewed available ER/hospitalization records through the EMR   MDM   1. Abdominal pain    Patient feels much better at this time.  Labs and ultrasound demonstrates no acute pathology.  Some of this may be related to constipation with occasional colonic spasm.  Patient be referred to gastroenterology for followup.  She understands to return to the ER for new or worsening symptoms    Lyanne Co, MD 03/15/13 1554

## 2013-03-15 NOTE — Progress Notes (Signed)
[facility] ED Cm note pt without pcp Cm spoke with pt. CM knocked on pt door, no answer, cm opened pt door and note female coming off bed with pt.  Cm inquired about pt's pcp Pt stated she did not have a pcp Cm inquired if a pcp was entered on her medicaid card Female at the bedside gave pt her medicaid card Pt looked at the card and stated she did not see one on the page CM informed the pt that if she had one it would be on the right side of the card sheet.  Pt handed the card to the pt and asked "do you want to see it." Cm replied okay and reviewed the card Cm mentioned to pt that she was right there was not a name on the card related to medicaid family planning.  Pt asked CM "is that all you wanted" Cm informed her yes she wanted to verify if she had a family doctor and to enter her pcp if she had one in EP[employer].  Pt asked for CM to give her the results of her labs and stated she had been waiting a long time.  CM informed pt that her nurse and doctor would have to provide her lab results to her and it may take a while but Cm would notify her nurse and doctor upon leaving of her request.  Pt stated again she had been waiting a long time and "am about to leave"  CM spoke with pt about general wait times for the ED and for return of lab results CM noted per CM record pt had been in [facility] ED about an hour at the time of the interaction and mentioned it to the pt that it had been about an hour but the nurse or doctor can let her know as soon as the results returned Pt asked if Cm could go tell her nurse and Cm informed her she would Cm inquired of nursing staff the name of the pt's nurse and was informed the nurse went to transport a pt to ICU.  CM informed the pt that her nurse recently left to take a pt to ICU and would return back soon and provide her with the results Pt inquired "how soon is soon?"  Cm informed pt she was not able to inform her of the time at this time and at this time ICU pt was priority and being care for but  the nurse and or doctor would return and provide her test results to her  CM reviewed this with ED charge RN, pt's nurse.

## 2013-03-15 NOTE — ED Notes (Signed)
Pt c/o abd pain described as sharp, cramping intermittent pain x2 weeks.  Pt also c/o being cold.

## 2013-05-02 ENCOUNTER — Other Ambulatory Visit: Payer: Self-pay

## 2013-11-02 ENCOUNTER — Emergency Department (INDEPENDENT_AMBULATORY_CARE_PROVIDER_SITE_OTHER)
Admission: EM | Admit: 2013-11-02 | Discharge: 2013-11-02 | Disposition: A | Discharge status: Home / Self Care | Payer: Self-pay | Source: Home / Self Care | Attending: Emergency Medicine | Admitting: Emergency Medicine

## 2013-11-02 ENCOUNTER — Encounter (HOSPITAL_COMMUNITY): Payer: Self-pay | Admitting: Emergency Medicine

## 2013-11-02 DIAGNOSIS — L42 Pityriasis rosea: Secondary | ICD-10-CM

## 2013-11-02 MED ORDER — HYDROXYZINE HCL 25 MG PO TABS: 25.0000 mg | ORAL_TABLET | Freq: Every evening | ORAL | Status: DC | PRN

## 2013-11-02 MED ORDER — HYDROCORTISONE 2.5 % EX CREA: TOPICAL_CREAM | Freq: Two times a day (BID) | CUTANEOUS | Status: DC

## 2013-11-02 NOTE — ED Provider Notes (Signed)
CSN: [id number]     Arrival date & time 11/02/13  1607 History   First MD Initiated Contact with Patient 11/02/13 1718     Chief Complaint  Patient presents with  . Rash   (Consider location/radiation/quality/duration/timing/severity/associated sxs/prior Treatment) HPI Comments: Rash started on L abd in larger patch, and has spread over trunk, arms and upper legs. No new meds, foods, personal care products.   Patient is a 23 y.o. female presenting with rash. The history is provided by the patient.  Rash Location:  Full body Quality: itchiness, redness and scaling   Severity:  Moderate Onset quality:  Gradual Duration:  1 week Timing:  Constant Progression:  Spreading Chronicity:  New Relieved by:  Nothing Ineffective treatments:  Antihistamines and topical steroids (poison ivy cream) Associated symptoms: no fever     History reviewed. No pertinent past medical history. History reviewed. No pertinent past surgical history. History reviewed. No pertinent family history. History  Substance Use Topics  . Smoking status: Never Smoker   . Smokeless tobacco: Not on file  . Alcohol Use: Yes     Comment: occ   OB History   Grav Para Term Preterm Abortions TAB SAB Ect Mult Living                 Review of Systems  Constitutional: Negative for fever.  Skin: Positive for rash.    Allergies  Review of patient's allergies indicates no known allergies.  Home Medications   Prior to Admission medications   Medication Sig Start Date End Date Taking? Authorizing Provider  docusate sodium (COLACE) 100 MG capsule Take 1 capsule (100 mg total) by mouth every 12 (twelve) hours. 03/15/13   Lyanne CoKevin M Campos, MD  hydrocortisone 2.5 % cream Apply topically 2 (two) times daily. 11/02/13   Cathlyn ParsonsAngela M Delsie Amador, NP  hydrOXYzine (ATARAX/VISTARIL) 25 MG tablet Take 1 tablet (25 mg total) by mouth at bedtime as needed for itching. 11/02/13   Cathlyn ParsonsAngela M Jeriyah Granlund, NP  medroxyPROGESTERone (DEPO-PROVERA) 150 MG/ML  injection Inject 150 mg into the muscle every 3 (three) months.    Historical Provider, MD  ondansetron (ZOFRAN ODT) 8 MG disintegrating tablet Take 1 tablet (8 mg total) by mouth every 8 (eight) hours as needed for nausea. 03/15/13   Lyanne CoKevin M Campos, MD  valACYclovir (VALTREX) 1000 MG tablet Take 1,000 mg by mouth.    Historical Provider, MD   BP 120/83  Pulse 77  Temp(Src) 98.5 F (36.9 C) (Oral)  Resp 18  SpO2 99% Physical Exam  Constitutional: She appears well-developed and well-nourished. No distress.  Skin: Skin is warm and dry. Rash noted.  Scattered scaling pink slightly raised patches on torso, arms and upper thighs in a christmas tree pattern on back that appears c/w pityriasis rosea    ED Course  Procedures (including critical care time) Labs Review Labs Reviewed - No data to display  Imaging Review No results found.   MDM   1. Pityriasis rosea   pt having significant itching. Pt to use claritin or allegra or zyrtec every morning, rx hydroxyzine 25mg  qhs #30. Also rx hydrocortisone 2.5% cream to use on particularly itchy areas.  Pt left unhappy that there is no rapid cure for rash and that I looked up treatment options and dosages in a medical reference while in the room with her.      Cathlyn ParsonsAngela M Khamarion Bjelland, NP 11/02/13 1805

## 2013-11-02 NOTE — ED Provider Notes (Signed)
Medical screening examination/treatment/procedure(s) were performed by non-physician practitioner and as supervising physician I was immediately available for consultation/collaboration.  Chanin Frumkin, M.D.  Digby Groeneveld C Jeannetta Cerutti, MD 11/02/13 1910 

## 2013-11-02 NOTE — ED Notes (Signed)
Pt c/o rash all over body x 1 week. Pt denies change in medications, food, or hygiene products. Denies SOB. Pt has tried Benadryl and Hydrocortisone with no relief. Pt is alert and oriented and in no acute distress.

## 2013-11-02 NOTE — Discharge Instructions (Signed)

## 2014-07-01 ENCOUNTER — Encounter (HOSPITAL_COMMUNITY): Payer: Self-pay | Admitting: *Deleted

## 2014-07-01 ENCOUNTER — Emergency Department (HOSPITAL_COMMUNITY)
Admission: EM | Admit: 2014-07-01 | Discharge: 2014-07-01 | Disposition: A | Discharge status: Home / Self Care | Payer: Self-pay | Source: Home / Self Care | Attending: Family Medicine | Admitting: Family Medicine

## 2014-07-01 DIAGNOSIS — N390 Urinary tract infection, site not specified: Secondary | ICD-10-CM

## 2014-07-01 LAB — POCT URINALYSIS DIP (DEVICE)
Glucose, UA: 250 mg/dL — AB
Nitrite: POSITIVE — AB
PH: 6 (ref 5.0–8.0)
Protein, ur: >=300 mg/dL — AB
Specific Gravity, Urine: >=1.03 (ref 1.005–1.030)
Urobilinogen, UA: 1 mg/dL (ref 0.0–1.0)

## 2014-07-01 LAB — POCT PREGNANCY, URINE: Preg Test, Ur: NEGATIVE

## 2014-07-01 MED ORDER — CEPHALEXIN 500 MG PO CAPS: 500.0000 mg | ORAL_CAPSULE | Freq: Four times a day (QID) | ORAL | Status: DC

## 2014-07-01 NOTE — Discharge Instructions (Signed)
Take all of medicine as directed, drink lots of fluids, see your doctor if further problems. °

## 2014-07-01 NOTE — ED Notes (Signed)
Pt  Has  Symptoms  Of  Burning  On  Urination   With  Frequency       Has  Had  Bladder    Problems  In  Past

## 2014-07-01 NOTE — ED Provider Notes (Signed)
CSN: [id number]     Arrival date & time 07/01/14  0900 History   First MD Initiated Contact with Patient 07/01/14 [phone]     Chief Complaint  Patient presents with  . Urinary Tract Infection   (Consider location/radiation/quality/duration/timing/severity/associated sxs/prior Treatment) Patient is a 24 y.o. female presenting with urinary tract infection. The history is provided by the patient.  Urinary Tract Infection This is a new problem. The current episode started more than 1 week ago. The problem has been gradually worsening. Pertinent negatives include no chest pain, no abdominal pain and no shortness of breath.    History reviewed. No pertinent past medical history. History reviewed. No pertinent past surgical history. History reviewed. No pertinent family history. History  Substance Use Topics  . Smoking status: Never Smoker   . Smokeless tobacco: Not on file  . Alcohol Use: Yes     Comment: occ   OB History    No data available     Review of Systems  Constitutional: Negative.   Respiratory: Negative for shortness of breath.   Cardiovascular: Negative for chest pain.  Gastrointestinal: Negative for nausea, vomiting and abdominal pain.  Genitourinary: Positive for dysuria, urgency and frequency.  Skin: Negative.     Allergies  Review of patient's allergies indicates no known allergies.  Home Medications   Prior to Admission medications   Medication Sig Start Date End Date Taking? Authorizing Provider  cephALEXin (KEFLEX) 500 MG capsule Take 1 capsule (500 mg total) by mouth 4 (four) times daily. Take all of medicine and drink lots of fluids 07/01/14   Linna Hoff, MD  docusate sodium (COLACE) 100 MG capsule Take 1 capsule (100 mg total) by mouth every 12 (twelve) hours. 03/15/13   Lyanne Co, MD  hydrocortisone 2.5 % cream Apply topically 2 (two) times daily. 11/02/13   Cathlyn Parsons, NP  hydrOXYzine (ATARAX/VISTARIL) 25 MG tablet Take 1 tablet (25 mg total) by  mouth at bedtime as needed for itching. 11/02/13   Cathlyn Parsons, NP  medroxyPROGESTERone (DEPO-PROVERA) 150 MG/ML injection Inject 150 mg into the muscle every 3 (three) months.    Historical Provider, MD  ondansetron (ZOFRAN ODT) 8 MG disintegrating tablet Take 1 tablet (8 mg total) by mouth every 8 (eight) hours as needed for nausea. 03/15/13   Lyanne Co, MD  valACYclovir (VALTREX) 1000 MG tablet Take 1,000 mg by mouth.    Historical Provider, MD   BP 117/75 mmHg  Pulse 91  Temp(Src) 98.6 F (37 C) (Oral)  Resp 16  SpO2 98% Physical Exam  Constitutional: She is oriented to person, place, and time. She appears well-developed and well-nourished.  Abdominal: Soft. Bowel sounds are normal. She exhibits no distension and no mass. There is no tenderness. There is no rebound and no guarding.  Neurological: She is alert and oriented to person, place, and time.  Skin: Skin is warm and dry.  Nursing note and vitals reviewed.   ED Course  Procedures (including critical care time) Labs Review Labs Reviewed  POCT URINALYSIS DIP (DEVICE) - Abnormal; Notable for the following:    Glucose, UA 250 (*)    Bilirubin Urine SMALL (*)    Ketones, ur TRACE (*)    Hgb urine dipstick LARGE (*)    Protein, ur >=300 (*)    Nitrite POSITIVE (*)    Leukocytes, UA LARGE (*)    All other components within normal limits  POCT PREGNANCY, URINE   U/a abnl. Imaging Review  No results found.   MDM   1. UTI (lower urinary tract infection)       Linna HoffJames D Jamirra Curnow, MD 07/01/14 [phone]

## 2016-02-15 ENCOUNTER — Emergency Department (HOSPITAL_COMMUNITY): Payer: Managed Care, Other (non HMO) | Admitting: Anesthesiology

## 2016-02-15 ENCOUNTER — Encounter (HOSPITAL_COMMUNITY)
Admission: EM | Disposition: A | Discharge status: Home / Self Care | Payer: Self-pay | Source: Home / Self Care | Attending: Emergency Medicine

## 2016-02-15 ENCOUNTER — Encounter (HOSPITAL_COMMUNITY): Payer: Self-pay | Admitting: Nurse Practitioner

## 2016-02-15 ENCOUNTER — Emergency Department (HOSPITAL_COMMUNITY): Payer: Managed Care, Other (non HMO)

## 2016-02-15 ENCOUNTER — Observation Stay (HOSPITAL_COMMUNITY)
Admission: EM | Admit: 2016-02-15 | Discharge: 2016-02-16 | Disposition: A | Discharge status: Home / Self Care | Payer: Managed Care, Other (non HMO) | Attending: General Surgery | Admitting: General Surgery

## 2016-02-15 DIAGNOSIS — K353 Acute appendicitis with localized peritonitis, without perforation or gangrene: Secondary | ICD-10-CM

## 2016-02-15 DIAGNOSIS — K3580 Unspecified acute appendicitis: Secondary | ICD-10-CM | POA: Diagnosis not present

## 2016-02-15 DIAGNOSIS — Z9049 Acquired absence of other specified parts of digestive tract: Secondary | ICD-10-CM

## 2016-02-15 HISTORY — PX: LAPAROSCOPIC APPENDECTOMY: SHX408

## 2016-02-15 LAB — URINALYSIS, ROUTINE W REFLEX MICROSCOPIC
Bilirubin Urine: NEGATIVE
GLUCOSE, UA: NEGATIVE mg/dL
HGB URINE DIPSTICK: NEGATIVE
Ketones, ur: >80 mg/dL — AB
Nitrite: NEGATIVE
PH: 6 (ref 5.0–8.0)
PROTEIN: NEGATIVE mg/dL
SPECIFIC GRAVITY, URINE: 1.046 — AB (ref 1.005–1.030)

## 2016-02-15 LAB — COMPREHENSIVE METABOLIC PANEL
ALT: 15 U/L (ref 14–54)
AST: 26 U/L (ref 15–41)
Albumin: 4 g/dL (ref 3.5–5.0)
Alkaline Phosphatase: 78 U/L (ref 38–126)
Anion gap: 8 (ref 5–15)
BUN: 11 mg/dL (ref 6–20)
CHLORIDE: 106 mmol/L (ref 101–111)
CO2: 22 mmol/L (ref 22–32)
CREATININE: 0.81 mg/dL (ref 0.44–1.00)
Calcium: 9.2 mg/dL (ref 8.9–10.3)
GFR calc non Af Amer: >60 mL/min (ref >60)
Glucose, Bld: 118 mg/dL — ABNORMAL HIGH (ref 65–99)
Potassium: 3.8 mmol/L (ref 3.5–5.1)
SODIUM: 136 mmol/L (ref 135–145)
Total Bilirubin: 1.1 mg/dL (ref 0.3–1.2)
Total Protein: 7.5 g/dL (ref 6.5–8.1)

## 2016-02-15 LAB — CBC
HEMATOCRIT: 37.2 % (ref 36.0–46.0)
Hemoglobin: 12.8 g/dL (ref 12.0–15.0)
MCH: 26.7 pg (ref 26.0–34.0)
MCHC: 34.4 g/dL (ref 30.0–36.0)
MCV: 77.7 fL — AB (ref 78.0–100.0)
PLATELETS: 258 10*3/uL (ref 150–400)
RBC: 4.79 MIL/uL (ref 3.87–5.11)
RDW: 12.9 % (ref 11.5–15.5)
WBC: 10.3 10*3/uL (ref 4.0–10.5)

## 2016-02-15 LAB — I-STAT BETA HCG BLOOD, ED (MC, WL, AP ONLY)

## 2016-02-15 LAB — URINE MICROSCOPIC-ADD ON: RBC / HPF: NONE SEEN RBC/hpf (ref 0–5)

## 2016-02-15 LAB — LIPASE, BLOOD: LIPASE: 18 U/L (ref 11–51)

## 2016-02-15 SURGERY — APPENDECTOMY, LAPAROSCOPIC
Anesthesia: General | Site: Abdomen

## 2016-02-15 MED ORDER — PROMETHAZINE HCL 25 MG/ML IJ SOLN: 12.5000 mg | Freq: Four times a day (QID) | INTRAMUSCULAR | Status: DC | PRN

## 2016-02-15 MED ORDER — SUCCINYLCHOLINE CHLORIDE 200 MG/10ML IV SOSY
PREFILLED_SYRINGE | INTRAVENOUS | Status: AC
  Filled 2016-02-15: qty 10

## 2016-02-15 MED ORDER — PROPOFOL 10 MG/ML IV BOLUS
INTRAVENOUS | Status: DC | PRN
  Administered 2016-02-15: 130 mg via INTRAVENOUS

## 2016-02-15 MED ORDER — BUPIVACAINE-EPINEPHRINE 0.25% -1:200000 IJ SOLN
INTRAMUSCULAR | Status: DC | PRN
  Administered 2016-02-15: 30 mL

## 2016-02-15 MED ORDER — ONDANSETRON HCL 4 MG/2ML IJ SOLN: 4.0000 mg | Freq: Four times a day (QID) | INTRAMUSCULAR | Status: DC | PRN

## 2016-02-15 MED ORDER — OXYCODONE HCL 5 MG PO TABS
5.0000 mg | ORAL_TABLET | ORAL | Status: DC | PRN
  Administered 2016-02-16: 10 mg via ORAL
  Filled 2016-02-15: qty 2

## 2016-02-15 MED ORDER — LIDOCAINE 2% (20 MG/ML) 5 ML SYRINGE
INTRAMUSCULAR | Status: AC
  Filled 2016-02-15: qty 5

## 2016-02-15 MED ORDER — PROPOFOL 10 MG/ML IV BOLUS
INTRAVENOUS | Status: AC
Start: 2016-02-15 — End: 2016-02-15
  Filled 2016-02-15: qty 20

## 2016-02-15 MED ORDER — IOPAMIDOL (ISOVUE-300) INJECTION 61%
INTRAVENOUS | Status: AC
  Administered 2016-02-15: 100 mL
  Filled 2016-02-15: qty 100

## 2016-02-15 MED ORDER — BUPIVACAINE-EPINEPHRINE (PF) 0.25% -1:200000 IJ SOLN
INTRAMUSCULAR | Status: AC
  Filled 2016-02-15: qty 30

## 2016-02-15 MED ORDER — MORPHINE SULFATE (PF) 2 MG/ML IV SOLN
2.0000 mg | Freq: Once | INTRAVENOUS | Status: AC
  Administered 2016-02-15: 2 mg via INTRAVENOUS
  Filled 2016-02-15: qty 1

## 2016-02-15 MED ORDER — MIDAZOLAM HCL 2 MG/2ML IJ SOLN
INTRAMUSCULAR | Status: AC
  Filled 2016-02-15: qty 2

## 2016-02-15 MED ORDER — ROCURONIUM BROMIDE 100 MG/10ML IV SOLN
INTRAVENOUS | Status: DC | PRN
  Administered 2016-02-15: 30 mg via INTRAVENOUS

## 2016-02-15 MED ORDER — FENTANYL CITRATE (PF) 100 MCG/2ML IJ SOLN
INTRAMUSCULAR | Status: DC | PRN
  Administered 2016-02-15 (×4): 50 ug via INTRAVENOUS

## 2016-02-15 MED ORDER — 0.9 % SODIUM CHLORIDE (POUR BTL) OPTIME
TOPICAL | Status: DC | PRN
  Administered 2016-02-15: 1000 mL

## 2016-02-15 MED ORDER — FENTANYL CITRATE (PF) 100 MCG/2ML IJ SOLN
INTRAMUSCULAR | Status: AC
  Filled 2016-02-15: qty 2

## 2016-02-15 MED ORDER — MIDAZOLAM HCL 5 MG/5ML IJ SOLN
INTRAMUSCULAR | Status: DC | PRN
  Administered 2016-02-15: 2 mg via INTRAVENOUS

## 2016-02-15 MED ORDER — ACETAMINOPHEN 500 MG PO TABS
1000.0000 mg | ORAL_TABLET | Freq: Four times a day (QID) | ORAL | Status: DC
  Administered 2016-02-16: 1000 mg via ORAL
  Filled 2016-02-15: qty 2

## 2016-02-15 MED ORDER — DIPHENHYDRAMINE HCL 50 MG/ML IJ SOLN: 12.5000 mg | Freq: Four times a day (QID) | INTRAMUSCULAR | Status: DC | PRN

## 2016-02-15 MED ORDER — KCL IN DEXTROSE-NACL 20-5-0.45 MEQ/L-%-% IV SOLN
INTRAVENOUS | Status: AC
  Filled 2016-02-15: qty 1000

## 2016-02-15 MED ORDER — ONDANSETRON HCL 4 MG/2ML IJ SOLN
4.0000 mg | Freq: Once | INTRAMUSCULAR | Status: AC
  Administered 2016-02-15: 4 mg via INTRAVENOUS
  Filled 2016-02-15: qty 2

## 2016-02-15 MED ORDER — FAMOTIDINE IN NACL 20-0.9 MG/50ML-% IV SOLN
20.0000 mg | Freq: Two times a day (BID) | INTRAVENOUS | Status: DC
  Administered 2016-02-15: 20 mg via INTRAVENOUS
  Filled 2016-02-15: qty 50

## 2016-02-15 MED ORDER — SODIUM CHLORIDE 0.9 % IV BOLUS (SEPSIS)
1000.0000 mL | Freq: Once | INTRAVENOUS | Status: AC
  Administered 2016-02-15: 1000 mL via INTRAVENOUS

## 2016-02-15 MED ORDER — KCL IN DEXTROSE-NACL 20-5-0.45 MEQ/L-%-% IV SOLN
INTRAVENOUS | Status: DC
  Administered 2016-02-15: 1000 mL via INTRAVENOUS

## 2016-02-15 MED ORDER — LIDOCAINE HCL (CARDIAC) 20 MG/ML IV SOLN
INTRAVENOUS | Status: DC | PRN
  Administered 2016-02-15: 60 mg via INTRAVENOUS

## 2016-02-15 MED ORDER — SODIUM CHLORIDE 0.9 % IR SOLN
Status: DC | PRN
  Administered 2016-02-15: 1000 mL

## 2016-02-15 MED ORDER — IOPAMIDOL (ISOVUE-300) INJECTION 61%
INTRAVENOUS | Status: AC
  Filled 2016-02-15: qty 100

## 2016-02-15 MED ORDER — METOCLOPRAMIDE HCL 5 MG/ML IJ SOLN
5.0000 mg | Freq: Once | INTRAMUSCULAR | Status: AC
  Administered 2016-02-15: 5 mg via INTRAVENOUS
  Filled 2016-02-15: qty 2

## 2016-02-15 MED ORDER — MORPHINE SULFATE (PF) 2 MG/ML IV SOLN: 1.0000 mg | INTRAVENOUS | Status: DC | PRN

## 2016-02-15 MED ORDER — CEFTRIAXONE SODIUM 2 G IJ SOLR
2.0000 g | Freq: Once | INTRAMUSCULAR | Status: AC
  Administered 2016-02-15: 2 g via INTRAVENOUS
  Filled 2016-02-15: qty 2

## 2016-02-15 MED ORDER — ROCURONIUM BROMIDE 10 MG/ML (PF) SYRINGE
PREFILLED_SYRINGE | INTRAVENOUS | Status: AC
  Filled 2016-02-15: qty 10

## 2016-02-15 MED ORDER — DEXAMETHASONE SODIUM PHOSPHATE 4 MG/ML IJ SOLN
INTRAMUSCULAR | Status: DC | PRN
  Administered 2016-02-15: 10 mg via INTRAVENOUS

## 2016-02-15 MED ORDER — HYDROMORPHONE HCL 1 MG/ML IJ SOLN: 0.2500 mg | INTRAMUSCULAR | Status: DC | PRN

## 2016-02-15 MED ORDER — KETOROLAC TROMETHAMINE 30 MG/ML IJ SOLN
30.0000 mg | Freq: Three times a day (TID) | INTRAMUSCULAR | Status: DC
  Administered 2016-02-16: 30 mg via INTRAVENOUS
  Filled 2016-02-15: qty 1

## 2016-02-15 MED ORDER — LACTATED RINGERS IV SOLN
INTRAVENOUS | Status: DC | PRN
  Administered 2016-02-15: 18:00:00 via INTRAVENOUS

## 2016-02-15 MED ORDER — DIPHENHYDRAMINE HCL 12.5 MG/5ML PO ELIX: 12.5000 mg | ORAL_SOLUTION | Freq: Four times a day (QID) | ORAL | Status: DC | PRN

## 2016-02-15 MED ORDER — ONDANSETRON HCL 4 MG/2ML IJ SOLN
INTRAMUSCULAR | Status: DC | PRN
  Administered 2016-02-15: 4 mg via INTRAVENOUS

## 2016-02-15 MED ORDER — SUGAMMADEX SODIUM 200 MG/2ML IV SOLN
INTRAVENOUS | Status: DC | PRN
  Administered 2016-02-15: 200 mg via INTRAVENOUS

## 2016-02-15 MED ORDER — ENOXAPARIN SODIUM 40 MG/0.4ML ~~LOC~~ SOLN: 40.0000 mg | SUBCUTANEOUS | Status: DC

## 2016-02-15 MED ORDER — ONDANSETRON 4 MG PO TBDP: 4.0000 mg | ORAL_TABLET | Freq: Four times a day (QID) | ORAL | Status: DC | PRN

## 2016-02-15 MED ORDER — KETOROLAC TROMETHAMINE 30 MG/ML IJ SOLN
INTRAMUSCULAR | Status: DC | PRN
  Administered 2016-02-15: 30 mg via INTRAVENOUS

## 2016-02-15 MED ORDER — METRONIDAZOLE IN NACL 5-0.79 MG/ML-% IV SOLN
500.0000 mg | Freq: Once | INTRAVENOUS | Status: AC
  Administered 2016-02-15: 500 mg via INTRAVENOUS
  Filled 2016-02-15: qty 100

## 2016-02-15 SURGICAL SUPPLY — 58 items
APPLIER CLIP 5 13 M/L LIGAMAX5 (MISCELLANEOUS)
APPLIER CLIP ROT 10 11.4 M/L (STAPLE)
BENZOIN TINCTURE PRP APPL 2/3 (GAUZE/BANDAGES/DRESSINGS) ×3 IMPLANT
BLADE SURG ROTATE 9660 (MISCELLANEOUS) IMPLANT
CANISTER SUCTION 2500CC (MISCELLANEOUS) ×6 IMPLANT
CHLORAPREP W/TINT 26ML (MISCELLANEOUS) ×6 IMPLANT
CLIP APPLIE 5 13 M/L LIGAMAX5 (MISCELLANEOUS) IMPLANT
CLIP APPLIE ROT 10 11.4 M/L (STAPLE) IMPLANT
CLOSURE WOUND 1/2 X4 (GAUZE/BANDAGES/DRESSINGS) ×1
COVER SURGICAL LIGHT HANDLE (MISCELLANEOUS) ×3 IMPLANT
CUTTER FLEX LINEAR 45M (STAPLE) ×3 IMPLANT
DRSG TEGADERM 2-3/8X2-3/4 SM (GAUZE/BANDAGES/DRESSINGS) ×6 IMPLANT
DRSG TEGADERM 4X4.75 (GAUZE/BANDAGES/DRESSINGS) ×3 IMPLANT
ELECT REM PT RETURN 9FT ADLT (ELECTROSURGICAL) ×6
ELECTRODE REM PT RTRN 9FT ADLT (ELECTROSURGICAL) ×2 IMPLANT
ENDOLOOP SUT PDS II  0 18 (SUTURE) ×2
ENDOLOOP SUT PDS II 0 18 (SUTURE) ×1 IMPLANT
GAUZE SPONGE 2X2 8PLY STRL LF (GAUZE/BANDAGES/DRESSINGS) ×1 IMPLANT
GLOVE BIO SURGEON STRL SZ 6 (GLOVE) ×3 IMPLANT
GLOVE BIOGEL M STRL SZ7.5 (GLOVE) ×3 IMPLANT
GLOVE BIOGEL PI IND STRL 6 (GLOVE) ×1 IMPLANT
GLOVE BIOGEL PI IND STRL 7.0 (GLOVE) ×1 IMPLANT
GLOVE BIOGEL PI IND STRL 8 (GLOVE) ×1 IMPLANT
GLOVE BIOGEL PI INDICATOR 6 (GLOVE) ×2
GLOVE BIOGEL PI INDICATOR 7.0 (GLOVE) ×2
GLOVE BIOGEL PI INDICATOR 8 (GLOVE) ×2
GLOVE SURG SS PI 7.0 STRL IVOR (GLOVE) ×3 IMPLANT
GOWN STRL REUS W/ TWL LRG LVL3 (GOWN DISPOSABLE) ×5 IMPLANT
GOWN STRL REUS W/ TWL XL LVL3 (GOWN DISPOSABLE) ×1 IMPLANT
GOWN STRL REUS W/TWL LRG LVL3 (GOWN DISPOSABLE) ×10
GOWN STRL REUS W/TWL XL LVL3 (GOWN DISPOSABLE) ×2
KIT BASIN OR (CUSTOM PROCEDURE TRAY) ×6 IMPLANT
KIT ROOM TURNOVER OR (KITS) ×6 IMPLANT
LIQUID BAND (GAUZE/BANDAGES/DRESSINGS) ×3 IMPLANT
NS IRRIG 1000ML POUR BTL (IV SOLUTION) ×6 IMPLANT
PAD ARMBOARD 7.5X6 YLW CONV (MISCELLANEOUS) ×12 IMPLANT
POUCH RETRIEVAL ECOSAC 10 (ENDOMECHANICALS) ×2 IMPLANT
POUCH RETRIEVAL ECOSAC 10MM (ENDOMECHANICALS) ×4
RELOAD 45 VASCULAR/THIN (ENDOMECHANICALS) ×3 IMPLANT
RELOAD STAPLE TA45 3.5 REG BLU (ENDOMECHANICALS) IMPLANT
SCALPEL HARMONIC ACE (MISCELLANEOUS) ×3 IMPLANT
SCISSORS LAP 5X35 DISP (ENDOMECHANICALS) ×3 IMPLANT
SET IRRIG TUBING LAPAROSCOPIC (IRRIGATION / IRRIGATOR) ×3 IMPLANT
SLEEVE ENDOPATH XCEL 5M (ENDOMECHANICALS) ×3 IMPLANT
SPECIMEN JAR SMALL (MISCELLANEOUS) ×6 IMPLANT
SPONGE GAUZE 2X2 STER 10/PKG (GAUZE/BANDAGES/DRESSINGS) ×2
STRIP CLOSURE SKIN 1/2X4 (GAUZE/BANDAGES/DRESSINGS) ×2 IMPLANT
SUT MNCRL AB 4-0 PS2 18 (SUTURE) ×6 IMPLANT
SUT VICRYL 0 UR6 27IN ABS (SUTURE) IMPLANT
TOWEL OR 17X24 6PK STRL BLUE (TOWEL DISPOSABLE) ×6 IMPLANT
TOWEL OR 17X26 10 PK STRL BLUE (TOWEL DISPOSABLE) ×6 IMPLANT
TRAY FOLEY CATH 16FR SILVER (SET/KITS/TRAYS/PACK) ×3 IMPLANT
TRAY LAPAROSCOPIC MC (CUSTOM PROCEDURE TRAY) ×3 IMPLANT
TROCAR XCEL BLADELESS 5X75MML (TROCAR) ×6 IMPLANT
TROCAR XCEL BLUNT TIP 100MML (ENDOMECHANICALS) ×3 IMPLANT
TROCAR XCEL NON-BLD 11X100MML (ENDOMECHANICALS) ×3 IMPLANT
TROCAR XCEL NON-BLD 5MMX100MML (ENDOMECHANICALS) ×3 IMPLANT
TUBING INSUFFLATION (TUBING) ×6 IMPLANT

## 2016-02-15 NOTE — Anesthesia Procedure Notes (Signed)
Procedure Name: Intubation Date/Time: 02/15/2016 6:33 PM Performed by: Reine JustFLOWERS, Micheale Schlack T Pre-anesthesia Checklist: Patient identified, Emergency Drugs available, Suction available, Patient being monitored and Timeout performed Patient Re-evaluated:Patient Re-evaluated prior to inductionOxygen Delivery Method: Circle system utilized and Simple face mask Preoxygenation: Pre-oxygenation with 100% oxygen Intubation Type: IV induction Ventilation: Mask ventilation without difficulty Laryngoscope Size: Miller and 2 Grade View: Grade I Tube type: Oral Tube size: 7.0 mm Number of attempts: 1 Airway Equipment and Method: Patient positioned with wedge pillow and Stylet Placement Confirmation: ETT inserted through vocal cords under direct vision,  breath sounds checked- equal and bilateral and positive ETCO2 Secured at: 21 cm Tube secured with: Tape Dental Injury: Teeth and Oropharynx as per pre-operative assessment

## 2016-02-15 NOTE — ED Notes (Signed)
Patient transported to CT 

## 2016-02-15 NOTE — Op Note (Signed)
Theresa Berger [place] [id number] 06/01/1991 02/15/2016  Appendectomy, Lap, Procedure Note  Indications: The patient presented with a history of right-sided abdominal pain. A CT revealed findings consistent with acute appendicitis.  Pre-operative Diagnosis: Acute appendicitis without mention of peritonitis  Post-operative Diagnosis: Same  Surgeon: Atilano InaWILSON,Yaritzel Stange M   Assistants: none  Anesthesia: General endotracheal anesthesia  ASA Class: 1, e  Procedure Details  The patient was seen again in the Holding Room. The risks, benefits, complications, treatment options, and expected outcomes were discussed with the patient and/or family. The possibilities of perforation of viscus, bleeding, recurrent infection, the need for additional procedures, failure to diagnose a condition, and creating a complication requiring transfusion or operation were discussed. There was concurrence with the proposed plan and informed consent was obtained. The site of surgery was properly noted. The patient was taken to Operating Room, identified as Theresa Berger [place] and the procedure verified as Appendectomy. A Time Out was held and the above information confirmed.  The patient was placed in the supine position and general anesthesia was induced, along with placement of orogastric tube, SCDs, and a Foley catheter. The abdomen was prepped and draped in a sterile fashion. A 1.5 centimeter infraumbilical incision was made.  The umbilical stalk was elevated, and the midline fascia was incised with a #11 blade.  A Kelly clamp was used to confirm entrance into the peritoneal cavity.  A pursestring suture was passed around the incision with a 0 Vicryl.  A 12mm Hasson was introduced into the abdomen and the tails of the suture were used to hold the Hasson in place.   The pneumoperitoneum was then established to steady pressure of 15 mmHg.  Additional 5 mm cannulas then placed in the left lower quadrant of the abdomen and the suprapubic  region under direct visualization. A careful evaluation of the entire abdomen was carried out. The patient was placed in Trendelenburg and left lateral decubitus position. The small intestines were retracted in the cephalad and left lateral direction away from the pelvis and right lower quadrant. The patient was found to have an inflammed appendix that was extending behind the cecum. There was no evidence of perforation.  The appendix was carefully dissected. The appendix was was skeletonized with the harmonic scalpel.  As I was holding the appendix with a grasper, a fecalith passed from the appendix into the cecum.  The appendix was divided at its base using an endo-GIA stapler with a white load. No appendiceal stump was left in place. The appendix was removed from the abdomen with an Ecco bag through the umbilical port.  There was no evidence of bleeding, leakage, or complication after division of the appendix. Irrigation was also performed and irrigate suctioned from the abdomen as well.  The umbilical port site was closed with the purse string suture. The closure was viewed laparoscopically. There was no residual palpable fascial defect.  The trocar site skin wounds were closed with 4-0 Monocryl. Benzoin, steri-strips, and bandages were applied to the skin incisions.  Instrument, sponge, and needle counts were correct at the conclusion of the case.   Findings: The appendix was found to be inflamed. There were not signs of necrosis.  There was not perforation. There was not abscess formation.  Estimated Blood Loss:  Minimal         Drains: none         Specimens: appendix         Complications:  None; patient tolerated the procedure well.  Disposition: PACU - hemodynamically stable.         Condition: stable  Leighton Ruff. Redmond Pulling, MD, FACS General, Bariatric, & Minimally Invasive Surgery [facility], [place]

## 2016-02-15 NOTE — Transfer of Care (Signed)
Immediate Anesthesia Transfer of Care Note  Patient: Theresa Berger  Procedure(s) Performed: Procedure(s): APPENDECTOMY LAPAROSCOPIC (N/A)  Patient Location: PACU  Anesthesia Type:General  Level of Consciousness: awake, alert  and oriented  Airway & Oxygen Therapy: Patient Spontanous Breathing  Post-op Assessment: Report given to RN and Post -op Vital signs reviewed and stable  Post vital signs: Reviewed and stable  Last Vitals:  Vitals:   02/15/16 1645 02/15/16 1937  BP: 126/87 129/84  Pulse: 75 (!) 114  Resp: 13 19  Temp:  36.7 C    Last Pain:  Vitals:   02/15/16 1937  TempSrc:   PainSc: (P) 0-No pain         Complications: No apparent anesthesia complications

## 2016-02-15 NOTE — Anesthesia Postprocedure Evaluation (Signed)
Anesthesia Post Note  Patient: Theresa Berger  Procedure(s) Performed: Procedure(s) (LRB): APPENDECTOMY LAPAROSCOPIC (N/A)  Patient location during evaluation: PACU Anesthesia Type: General Level of consciousness: awake and alert Pain management: pain level controlled Vital Signs Assessment: post-procedure vital signs reviewed and stable Respiratory status: spontaneous breathing, nonlabored ventilation, respiratory function stable and patient connected to nasal cannula oxygen Cardiovascular status: blood pressure returned to baseline and stable Postop Assessment: no signs of nausea or vomiting Anesthetic complications: no    Last Vitals:  Vitals:   02/15/16 2009 02/15/16 2015  BP: 122/83   Pulse: 90 80  Resp: 18 17  Temp:      Last Pain:  Vitals:   02/15/16 2007  TempSrc:   PainSc: 0-No pain                 Francina Beery,JAMES TERRILL

## 2016-02-15 NOTE — ED Provider Notes (Signed)
[facility]-EMERGENCY DEPT Provider Note   CSN: [id number] Arrival date & time: 02/15/16  1132     History   Chief Complaint Chief Complaint  Patient presents with  . Abdominal Pain    HPI Theresa Berger is a 25 y.o. female.  Theresa Berger is a 25 y.o. female with no pertinent medical history presents to ED with complaint of abdominal pain. Patient states pain started at 4am this morning, located in periumbilical/epigastric region, 10/10, constant, burning sensation with radiation into chest. She has associated N/V with yellow/green emesis. No exacerbating or alleviating factors. No treatments tried at home. Patient endorses associated chills. She denies fever, diarrhea, constipation, blood in stool, dysuria, hematuria, vaginal pain, pelvic pain, vaginal discharge, or vaginal bleeding.       History reviewed. No pertinent past medical history.  There are no active problems to display for this patient.   History reviewed. No pertinent surgical history.  OB History    No data available       Home Medications    Prior to Admission medications   Medication Sig Start Date End Date Taking? Authorizing Provider  levonorgestrel-ethinyl estradiol (KURVELO) 0.15-30 MG-MCG tablet Take 1 tablet by mouth daily.   Yes Historical Provider, MD  cephALEXin (KEFLEX) 500 MG capsule Take 1 capsule (500 mg total) by mouth 4 (four) times daily. Take all of medicine and drink lots of fluids Patient not taking: Reported on 02/15/2016 07/01/14   Linna HoffJames D Kindl, MD  docusate sodium (COLACE) 100 MG capsule Take 1 capsule (100 mg total) by mouth every 12 (twelve) hours. Patient not taking: Reported on 02/15/2016 03/15/13   Azalia BilisKevin Campos, MD  hydrocortisone 2.5 % cream Apply topically 2 (two) times daily. Patient not taking: Reported on 02/15/2016 11/02/13   Cathlyn ParsonsAngela M Kabbe, NP  hydrOXYzine (ATARAX/VISTARIL) 25 MG tablet Take 1 tablet (25 mg total) by mouth at bedtime as needed for itching. Patient not  taking: Reported on 02/15/2016 11/02/13   Cathlyn ParsonsAngela M Kabbe, NP  ondansetron (ZOFRAN ODT) 8 MG disintegrating tablet Take 1 tablet (8 mg total) by mouth every 8 (eight) hours as needed for nausea. Patient not taking: Reported on 02/15/2016 03/15/13   Azalia BilisKevin Campos, MD    Family History History reviewed. No pertinent family history.  Social History Social History  Substance Use Topics  . Smoking status: Never Smoker  . Smokeless tobacco: Never Used  . Alcohol use Yes     Comment: occ     Allergies   Review of patient's allergies indicates no known allergies.   Review of Systems Review of Systems  Constitutional: Negative for chills, diaphoresis and fever.  HENT: Negative for sore throat.   Eyes: Negative for visual disturbance.  Respiratory: Negative for shortness of breath.   Cardiovascular: Positive for chest pain ( burning, radiation from abdomen).  Gastrointestinal: Positive for abdominal pain, nausea and vomiting. Negative for blood in stool, constipation and diarrhea.  Genitourinary: Negative for dysuria and hematuria.  Musculoskeletal: Negative for neck pain.  Skin: Negative for rash.  Neurological: Negative for syncope.     Physical Exam Updated Vital Signs BP 129/86 (BP Location: Right Arm)   Pulse 101   Temp 97.9 F (36.6 C) (Oral)   Resp 18   Ht 5\' 6"  (1.676 m)   SpO2 100%   Physical Exam  Constitutional: She appears well-developed and well-nourished. No distress.  HENT:  Head: Normocephalic and atraumatic.  Mouth/Throat: Oropharynx is clear and moist. No oropharyngeal exudate.  Eyes: Conjunctivae and EOM  are normal. Pupils are equal, round, and reactive to light. Right eye exhibits no discharge. Left eye exhibits no discharge. No scleral icterus.  Neck: Normal range of motion. Neck supple.  Cardiovascular: Regular rhythm, normal heart sounds and intact distal pulses.  Tachycardia present.   No murmur heard. Pulmonary/Chest: Effort normal and breath sounds  normal. No respiratory distress.  Abdominal: Soft. Bowel sounds are normal. She exhibits no distension. There is tenderness. There is guarding. There is no rigidity, no rebound and no CVA tenderness.  Musculoskeletal: Normal range of motion.  Lymphadenopathy:    She has no cervical adenopathy.  Neurological: She is alert. Coordination normal.  Skin: Skin is warm and dry. She is not diaphoretic.  Psychiatric: She has a normal mood and affect. Her behavior is normal.     ED Treatments / Results  Labs (all labs ordered are listed, but only abnormal results are displayed) Labs Reviewed  COMPREHENSIVE METABOLIC PANEL - Abnormal; Notable for the following:       Result Value   Glucose, Bld 118 (*)    All other components within normal limits  CBC - Abnormal; Notable for the following:    MCV 77.7 (*)    All other components within normal limits  LIPASE, BLOOD  URINALYSIS, ROUTINE W REFLEX MICROSCOPIC (NOT [facility])  I-STAT BETA HCG BLOOD, ED ([facility], [facility], [facility] ONLY)    EKG  EKG Interpretation  Date/Time:  Monday February 15 2016 11:34:54 EDT Ventricular Rate:  91 PR Interval:  128 QRS Duration: 82 QT Interval:  354 QTC Calculation: 435 R Axis:   72 Text Interpretation:  Normal sinus rhythm No previous tracing Confirmed by Denton LankSTEINL  MD, Caryn BeeKEVIN ([id number]) on 02/15/2016 1:05:11 PM       Radiology No results found.  Procedures Procedures (including critical care time)  Medications Ordered in ED Medications  sodium chloride 0.9 % bolus 1,000 mL (not administered)  morphine 2 MG/ML injection 2 mg (not administered)  cefTRIAXone (ROCEPHIN) 2 g in dextrose 5 % 50 mL IVPB (not administered)    And  metroNIDAZOLE (FLAGYL) IVPB 500 mg (not administered)  sodium chloride 0.9 % bolus 1,000 mL (1,000 mLs Intravenous New Bag/Given 02/15/16 1343)  ondansetron (ZOFRAN) injection 4 mg (4 mg Intravenous Given 02/15/16 1338)  morphine 2 MG/ML injection 2 mg (2 mg Intravenous Given 02/15/16 1340)    metoCLOPramide (REGLAN) injection 5 mg (5 mg Intravenous Given 02/15/16 1448)  morphine 2 MG/ML injection 2 mg (2 mg Intravenous Given 02/15/16 1447)  iopamidol (ISOVUE-300) 61 % injection (100 mLs  Contrast Given 02/15/16 1501)     Initial Impression / Assessment and Plan / ED Course  I have reviewed the triage vital signs and the nursing notes.  Pertinent labs & imaging results that were available during my care of the patient were reviewed by me and considered in my medical decision making (see chart for details).  Clinical Course  Comment By Time  Patient continuing to vomit. No improvement in abdominal pain.  Lona KettleAshley Laurel Meyer, [place]-C 08/21 1430  On re-evaluation patient endorses slight improvement in pain, requesting more pain medication.  Lona Kettleshley Laurel Meyer, [place]-C 08/21 1552  Spoke with radiology, CT shows acute appendicitis Lona Kettleshley Laurel Meyer, PA-C 08/21 1552    Patient is afebrile and non-toxic appearing in apparent discomfort, she is shivering. Vital signs remarkable for borderline tachycardia. Physical exam remarkable for diffuse abdominal tenderness with mild guarding. Pregnancy test negative - low suspicion for ectopic. CMP re-assuring. Low suspicion for  acute cholecystitis with nml LFTs and bili. Lipase normal - low suspicion for pancreatitis. CBC re-assuring. Suspect ?acid reflux vs. ?gastritis. IVF, pain medication, and anti-emetics initiated. If no improvement, will consider CT abd/pelvis for further evaluation.   Following initial dose of pain medication, patient endorses no improvement in sxs, continuing to vomit. Will CT abdomen and pelvis repeat pain medication.   3:54 PM: Spoke with Radiology, CT positive for acute appendicitis. IV ABX initiated. Redosed pain medication. Last PO intake last night. Consult to general surgery.   3:57 PM: Spoke with Dr. Sheliah Hatch of General Surgery, greatly appreciated his time and input, will see patient.   4:42 PM: patient to go to OR.    Final Clinical Impressions(s) / ED Diagnoses   Final diagnoses:  Acute appendicitis, unspecified acute appendicitis type    New Prescriptions New Prescriptions   No medications on file     Lona Kettle, PA-C 02/15/16 1643    Cathren Laine, MD 02/18/16 1003

## 2016-02-15 NOTE — Interval H&P Note (Signed)
History and Physical Interval Note:  02/15/2016 6:16 PM  Theresa Berger  has presented today for surgery, with the diagnosis of appendicitis  The various methods of treatment have been discussed with the patient and family. After consideration of risks, benefits and other options for treatment, the patient has consented to  Procedure(s): APPENDECTOMY LAPAROSCOPIC (N/A) as a surgical intervention .  The patient's history has been reviewed, patient examined, no change in status, stable for surgery.  I have reviewed the patient's chart and labs.  Questions were answered to the patient's satisfaction.    We discussed the etiology and management of acute appendicitis.  I recommended operative management along with IV antibiotics.  We discussed laparoscopic appendectomy. We discussed the risk and benefits of surgery including but not limited to bleeding, infection, injury to surrounding structures, need to convert to an open procedure, blood clot formation, post operative abscess or wound infection, staple line complications such as leak or bleeding, hernia formation, post operative ileus, need for additional procedures, anesthesia complications, and the typical postoperative course. I explained that the patient should expect a good improvement in their symptoms.   Mary SellaEric M. Andrey CampanileWilson, MD, FACS General, Bariatric, & Minimally Invasive Surgery [facility], [place]'   Atilano InaWILSON,Theresa Moyano M

## 2016-02-15 NOTE — OR Nursing (Signed)
All charting in OR record after 1904 completed by Darron DoomAngela Hall, RN.

## 2016-02-15 NOTE — Anesthesia Preprocedure Evaluation (Addendum)
Anesthesia Evaluation  Patient identified by MRN, date of birth, ID band Patient awake    Reviewed: Allergy & Precautions, H&P , NPO status , Patient's Chart, lab work & pertinent test results  Airway Mallampati: II  TM Distance: >3 FB Neck ROM: Full    Dental no notable dental hx. (+) Teeth Intact, Dental Advisory Given   Pulmonary neg pulmonary ROS,    Pulmonary exam normal breath sounds clear to auscultation       Cardiovascular negative cardio ROS   Rhythm:Regular Rate:Normal     Neuro/Psych negative neurological ROS  negative psych ROS   GI/Hepatic negative GI ROS, Neg liver ROS,   Endo/Other  negative endocrine ROS  Renal/GU negative Renal ROS  negative genitourinary   Musculoskeletal   Abdominal   Peds  Hematology negative hematology ROS (+)   Anesthesia Other Findings   Reproductive/Obstetrics negative OB ROS                            Anesthesia Physical Anesthesia Plan  ASA: I and emergent  Anesthesia Plan: General   Post-op Pain Management:    Induction: Intravenous, Rapid sequence and Cricoid pressure planned  Airway Management Planned: Oral ETT  Additional Equipment:   Intra-op Plan:   Post-operative Plan: Extubation in OR  Informed Consent: I have reviewed the patients History and Physical, chart, labs and discussed the procedure including the risks, benefits and alternatives for the proposed anesthesia with the patient or authorized representative who has indicated his/her understanding and acceptance.   Dental advisory given  Plan Discussed with: CRNA  Anesthesia Plan Comments:         Anesthesia Quick Evaluation  

## 2016-02-15 NOTE — ED Triage Notes (Signed)
Pt c/o abd pain radiating into her chest onset at 4am today. Describes as a burning pain. She reports n/v.Marland Kitchen. She denies bowel/bladder changes/fevers. She has tried nothing at home for the symptoms. She is alert and breathing easily.

## 2016-02-15 NOTE — Progress Notes (Signed)
Patient arrived to room via stretcher from pacu. Alert and oriented . Vital signs stable.orriented to room and procedures. No questions regarding hospitalization.Family to room.

## 2016-02-15 NOTE — H&P (Signed)
Theresa Berger is an 25 y.o. female.   Chief Complaint: abdominal pain HPI: 25 yo female with 2 days of abdominal pain. Pain is largely in the lower abdomen. She has had no similar episodes. She has nausea and vomiting. She has no diarrhea or constipation.  History reviewed. No pertinent past medical history.  History reviewed. No pertinent surgical history.  History reviewed. No pertinent family history. Social History:  reports that she has never smoked. She has never used smokeless tobacco. She reports that she drinks alcohol. She reports that she does not use drugs.  Allergies: No Known Allergies   (Not in a hospital admission)  Results for orders placed or performed during the hospital encounter of 02/15/16 (from the past 48 hour(s))  Lipase, blood     Status: None   Collection Time: 02/15/16 11:49 AM  Result Value Ref Range   Lipase 18 11 - 51 U/L  Comprehensive metabolic panel     Status: Abnormal   Collection Time: 02/15/16 11:49 AM  Result Value Ref Range   Sodium 136 135 - 145 mmol/L   Potassium 3.8 3.5 - 5.1 mmol/L   Chloride 106 101 - 111 mmol/L   CO2 22 22 - 32 mmol/L   Glucose, Bld 118 (H) 65 - 99 mg/dL   BUN 11 6 - 20 mg/dL   Creatinine, Ser 0.81 0.44 - 1.00 mg/dL   Calcium 9.2 8.9 - 10.3 mg/dL   Total Protein 7.5 6.5 - 8.1 g/dL   Albumin 4.0 3.5 - 5.0 g/dL   AST 26 15 - 41 U/L   ALT 15 14 - 54 U/L   Alkaline Phosphatase 78 38 - 126 U/L   Total Bilirubin 1.1 0.3 - 1.2 mg/dL   GFR calc non Af Amer >60 >60 mL/min   GFR calc Af Amer >60 >60 mL/min    Comment: (NOTE) The eGFR has been calculated using the CKD EPI equation. This calculation has not been validated in all clinical situations. eGFR's persistently <60 mL/min signify possible Chronic Kidney Disease.    Anion gap 8 5 - 15  CBC     Status: Abnormal   Collection Time: 02/15/16 11:49 AM  Result Value Ref Range   WBC 10.3 4.0 - 10.5 K/uL   RBC 4.79 3.87 - 5.11 MIL/uL   Hemoglobin 12.8 12.0 -  15.0 g/dL   HCT 37.2 36.0 - 46.0 %   MCV 77.7 (L) 78.0 - 100.0 fL   MCH 26.7 26.0 - 34.0 pg   MCHC 34.4 30.0 - 36.0 g/dL   RDW 12.9 11.5 - 15.5 %   Platelets 258 150 - 400 K/uL  I-Stat beta hCG blood, ED     Status: None   Collection Time: 02/15/16 11:58 AM  Result Value Ref Range   I-stat hCG, quantitative <5.0 <5 mIU/mL   Comment 3            Comment:   GEST. AGE      CONC.  (mIU/mL)   <=1 WEEK        5 - 50     2 WEEKS       50 - 500     3 WEEKS       100 - 10,000     4 WEEKS     1,000 - 30,000        FEMALE AND NON-PREGNANT FEMALE:     LESS THAN 5 mIU/mL    Ct Abdomen Pelvis W Contrast  Result Date:  02/15/2016 CLINICAL DATA:  Diffuse abdominal pain, nausea, vomiting since 4 a.m. EXAM: CT ABDOMEN AND PELVIS WITH CONTRAST TECHNIQUE: Multidetector CT imaging of the abdomen and pelvis was performed using the standard protocol following bolus administration of intravenous contrast. CONTRAST:  141m ISOVUE-300 IOPAMIDOL (ISOVUE-300) INJECTION 61% COMPARISON:  11/27/2012 FINDINGS: Lower chest: Lung bases are clear. No effusions. Heart is normal size. Hepatobiliary: No focal hepatic abnormality. Gallbladder unremarkable. Pancreas: No focal abnormality or ductal dilatation. Spleen: No focal abnormality.  Normal size. Adrenals/Urinary Tract: No adrenal abnormality. No focal renal abnormality. No stones or hydronephrosis. Urinary bladder is unremarkable. Stomach/Bowel: There is a dilated retrocecal appendix. High-density material noted within the proximal appendix and at the base which could reflect appendicolith. Appendix 16 mm in diameter. Slight surrounding inflammatory change. Findings compatible with acute appendicitis. Stomach, large and small bowel unremarkable. Vascular/Lymphatic: No evidence of aneurysm or adenopathy. Reproductive: Uterus and adnexa unremarkable.  No mass. Other: No free fluid or free air. Musculoskeletal: No acute bony abnormality or focal bone lesion. IMPRESSION: Dilated  appendix with probable high-density appendicolith is in the proximal appendix and surrounding inflammatory change. Findings compatible with acute appendicitis. These results were called by telephone at the time of interpretation on 02/15/2016 at 3:44 pm to [facility], who verbally acknowledged these results. Electronically Signed   By: KRolm BaptiseM.D.   On: 02/15/2016 15:49    Review of Systems  Constitutional: Negative for chills and fever.  HENT: Negative for hearing loss.   Eyes: Negative for blurred vision and double vision.  Respiratory: Negative for cough and hemoptysis.   Cardiovascular: Negative for chest pain and palpitations.  Gastrointestinal: Positive for abdominal pain, nausea and vomiting.  Genitourinary: Negative for dysuria and urgency.  Musculoskeletal: Negative for myalgias and neck pain.  Skin: Negative for itching and rash.  Neurological: Negative for dizziness, tingling and headaches.  Endo/Heme/Allergies: Does not bruise/bleed easily.  Psychiatric/Behavioral: Negative for depression and suicidal ideas.    Blood pressure 123/95, pulse 79, temperature 97.9 F (36.6 C), temperature source Oral, resp. rate 19, height '5\' 6"'  (1.676 m), SpO2 100 %. Physical Exam  Vitals reviewed. Constitutional: She is oriented to person, place, and time. She appears well-developed and well-nourished.  HENT:  Head: Normocephalic and atraumatic.  Eyes: Conjunctivae and EOM are normal. Pupils are equal, round, and reactive to light.  Neck: Normal range of motion. Neck supple.  Cardiovascular: Normal rate and regular rhythm.   Respiratory: Effort normal and breath sounds normal.  GI: Soft. Bowel sounds are normal. She exhibits no distension. There is tenderness in the right lower quadrant and suprapubic area.  Musculoskeletal: Normal range of motion.  Neurological: She is alert and oriented to person, place, and time.  Skin: Skin is warm and dry.  Psychiatric: She has a normal mood  and affect. Her behavior is normal.     Assessment/Plan 25yo female with acute appendicitis -IV abx -OR for lap appy -NPO -pain control  LMickeal Skinner MD 02/15/2016, 4:18 PM

## 2016-02-16 ENCOUNTER — Encounter: Payer: Self-pay | Admitting: General Surgery

## 2016-02-16 ENCOUNTER — Encounter (HOSPITAL_COMMUNITY): Payer: Self-pay | Admitting: *Deleted

## 2016-02-16 DIAGNOSIS — K3580 Unspecified acute appendicitis: Secondary | ICD-10-CM | POA: Diagnosis present

## 2016-02-16 MED ORDER — OXYCODONE HCL 5 MG PO TABS: 5.0000 mg | ORAL_TABLET | ORAL | 0 refills | Status: DC | PRN

## 2016-02-16 NOTE — Discharge Instructions (Signed)
Schedule and appointment at our office as soon as possible for a visit in 2 weeks. please arrive at least 30 min before your appointment to complete your check in paperwork.  If you are unable to arrive 30 min prior to your appointment time we may have to cancel or reschedule you.  LAPAROSCOPIC SURGERY: POST OP INSTRUCTIONS  1. DIET: Follow a light bland diet the first 24 hours after arrival home, such as soup, liquids, crackers, etc. Be sure to include lots of fluids daily. Avoid fast food or heavy meals as your are more likely to get nauseated. Eat a low fat the next few days after surgery.  2. Take your usually prescribed home medications unless otherwise directed. 3. PAIN CONTROL:  1. Pain is best controlled by a usual combination of three different methods TOGETHER:  1. Ice/Heat 2. Over the counter pain medication 3. Prescription pain medication 2. Most patients will experience some swelling and bruising around the incisions. Ice packs or heating pads (30-60 minutes up to 6 times a day) will help. Use ice for the first few days to help decrease swelling and bruising, then switch to heat to help relax tight/sore spots and speed recovery. Some people prefer to use ice alone, heat alone, alternating between ice & heat. Experiment to what works for you. Swelling and bruising can take several weeks to resolve.  3. It is helpful to take an over-the-counter pain medication regularly for the first few weeks. Choose one of the following that works best for you:  1. Naproxen (Aleve, etc) Two 220mg  tabs twice a day 2. Ibuprofen (Advil, etc) Three 200mg  tabs four times a day (every meal & bedtime) 3. Acetaminophen (Tylenol, etc) 500-650mg  four times a day (every meal & bedtime) 4. A prescription for pain medication (such as oxycodone, hydrocodone, etc) should be given to you upon discharge. Take your pain medication as prescribed.  1. If you are having problems/concerns with the prescription medicine (does  not control pain, nausea, vomiting, rash, itching, etc), please call us [phone]) [phone] to see if we need to switch you to a different pain medicine that will work better for you and/or control your side effect better. 2. If you need a refill on your pain medication, please contact your pharmacy. They will contact our office to request authorization. Prescriptions will not be filled after 5 pm or on week-ends. 4. Avoid getting constipated. Between the surgery and the pain medications, it is common to experience some constipation. Increasing fluid intake and taking a fiber supplement (such as Metamucil, Citrucel, FiberCon, MiraLax, etc) 1-2 times a day regularly will usually help prevent this problem from occurring. A mild laxative (prune juice, Milk of Magnesia, MiraLax, etc) should be taken according to package directions if there are no bowel movements after 48 hours.  5. Watch out for diarrhea. If you have many loose bowel movements, simplify your diet to bland foods & liquids for a few days. Stop any stool softeners and decrease your fiber supplement. Switching to mild anti-diarrheal medications (Kayopectate, Pepto Bismol) can help. If this worsens or does not improve, please call us. 6. Wash / shower every day. You may shower over the dressings as they are waterproof. Continue to shower over incision(s) after the dressing is off. 7. Remove your waterproof bandages 5 days after surgery. You may leave the incision open to air. You may replace a dressing/Band-Aid to cover the incision for comfort if you wish.  8. ACTIVITIES as tolerated:  1. You  may resume regular (light) daily activities beginning the next day--such as daily self-care, walking, climbing stairs--gradually increasing activities as tolerated. If you can walk 30 minutes without difficulty, it is safe to try more intense activity such as jogging, treadmill, bicycling, low-impact aerobics, swimming, etc. 2. Save the most intensive and strenuous  activity for last such as sit-ups, heavy lifting, contact sports, etc Refrain from any heavy lifting or straining until you are off narcotics for pain control.  3. DO NOT PUSH THROUGH PAIN. Let pain be your guide: If it hurts to do something, don't do it. Pain is your body warning you to avoid that activity for another week until the pain goes down. 4. You may drive when you are no longer taking prescription pain medication, you can comfortably wear a seatbelt, and you can safely maneuver your car and apply brakes. 5. You may have sexual intercourse when it is comfortable.  9. FOLLOW UP in our office  1. Please call [facility] at [phone]) [phone] to set up an appointment to see your surgeon in the office for a follow-up appointment approximately 2-3 weeks after your surgery. 2. Make sure that you call for this appointment the day you arrive home to insure a convenient appointment time.      10. IF YOU HAVE DISABILITY OR FAMILY LEAVE FORMS, BRING THEM TO THE               OFFICE FOR PROCESSING.   WHEN TO CALL US [phone]) [phone]:  1. Poor pain control 2. Reactions / problems with new medications (rash/itching, nausea, etc)  3. Fever over 101.5 F (38.5 C) 4. Inability to urinate 5. Nausea and/or vomiting 6. Worsening swelling or bruising 7. Continued bleeding from incision. 8. Increased pain, redness, or drainage from the incision  The clinic staff is available to answer your questions during regular business hours (8:30am-5pm). Please dont hesitate to call and ask to speak to one of our nurses for clinical concerns.  If you have a medical emergency, go to the nearest emergency room or call 911.  A [facility] from [facility] is always on call at the [facility]   [facility] [place]  [address], [address], [place], [place] [address] ?  MAIN: [phone] ? TOLL FREE: [phone] ?  FAX [phone]  [email or web address]

## 2016-02-16 NOTE — Care Management Note (Signed)
Case Management Note  Patient Details  Name: Theresa Berger MRN: [id number] Date of Birth: [birth year]  Subjective/Objective:                    Action/Plan: Patient discharging home with self care. No insurance listed but when CM met with the patient she states she does have insurance. Pt states she did not bring the card when admitted but asking for financials numbers to call them with the information. CM did provide her a coupon for her Oxycodone just in case she did not have card with her at pharmacy. Bedside RN updated.   Expected Discharge Date:                  Expected Discharge Plan:  Home/Self Care  In-House Referral:     Discharge planning Services  CM Consult  Post Acute Care Choice:    Choice offered to:     DME Arranged:    DME Agency:     HH Arranged:    [place] Agency:     Status of Service:  Completed, signed off  If discussed at [employer] of Stay Meetings, dates discussed:    Additional Comments:  Pollie Friar, RN 02/16/2016, 10:49 AM

## 2016-02-16 NOTE — Discharge Summary (Signed)
[facility] Discharge Summary   Patient ID: Theresa MazeQuinterra Jhaveri MRN: [id number] DOB/AGE: 24/10/1990 24 y.o.  Admit date: 02/15/2016 Discharge date: 02/16/2016  Admitting Diagnosis: Acute appendicitis  Discharge Diagnosis Patient Active Problem List   Diagnosis Date Noted  . Acute appendicitis 02/16/2016  . S/P laparoscopic appendectomy 02/15/2016    Consultants None  Imaging: Ct Abdomen Pelvis W Contrast  Result Date: 02/15/2016 CLINICAL DATA:  Diffuse abdominal pain, nausea, vomiting since 4 a.m. EXAM: CT ABDOMEN AND PELVIS WITH CONTRAST TECHNIQUE: Multidetector CT imaging of the abdomen and pelvis was performed using the standard protocol following bolus administration of intravenous contrast. CONTRAST:  100mL ISOVUE-300 IOPAMIDOL (ISOVUE-300) INJECTION 61% COMPARISON:  11/27/2012 FINDINGS: Lower chest: Lung bases are clear. No effusions. Heart is normal size. Hepatobiliary: No focal hepatic abnormality. Gallbladder unremarkable. Pancreas: No focal abnormality or ductal dilatation. Spleen: No focal abnormality.  Normal size. Adrenals/Urinary Tract: No adrenal abnormality. No focal renal abnormality. No stones or hydronephrosis. Urinary bladder is unremarkable. Stomach/Bowel: There is a dilated retrocecal appendix. High-density material noted within the proximal appendix and at the base which could reflect appendicolith. Appendix 16 mm in diameter. Slight surrounding inflammatory change. Findings compatible with acute appendicitis. Stomach, large and small bowel unremarkable. Vascular/Lymphatic: No evidence of aneurysm or adenopathy. Reproductive: Uterus and adnexa unremarkable.  No mass. Other: No free fluid or free air. Musculoskeletal: No acute bony abnormality or focal bone lesion. IMPRESSION: Dilated appendix with probable high-density appendicolith is in the proximal appendix and surrounding inflammatory change. Findings compatible with acute appendicitis. These results were  called by telephone at the time of interpretation on 02/15/2016 at 3:44 pm to [facility] , who verbally acknowledged these results. Electronically Signed   By: Charlett NoseKevin  Dover M.D.   On: 02/15/2016 15:49    Procedures Dr. Gaynelle AduEric Wilson (02/15/16) - Laparoscopic Appendectomy  Hospital Course:  25 year old AA female [facility] 2 days of lower abdominal pain associated with nausea and vomiting.  CT scan significant for acute appendicitis.  Patient was admitted and underwent procedure listed above.  Tolerated procedure well and was transferred to the floor.  Diet was advanced as tolerated.  On POD#1, the patient was voiding well, tolerating diet, ambulating well, pain well controlled, vital signs stable, incisions c/d/i and felt stable for discharge home.  Patient will follow up in our office in 2 weeks and knows to call with questions or concerns.  She will call to confirm appointment date/time.       Medication List    STOP taking these medications   cephALEXin 500 MG capsule Commonly known as:  KEFLEX   docusate sodium 100 MG capsule Commonly known as:  COLACE   hydrocortisone 2.5 % cream   hydrOXYzine 25 MG tablet Commonly known as:  ATARAX/VISTARIL   ondansetron 8 MG disintegrating tablet Commonly known as:  ZOFRAN ODT     TAKE these medications   KURVELO 0.15-30 MG-MCG tablet Generic drug:  levonorgestrel-ethinyl estradiol Take 1 tablet by mouth daily.   oxyCODONE 5 MG immediate release tablet Commonly known as:  Oxy IR/ROXICODONE Take 1 tablet (5 mg total) by mouth every 4 (four) hours as needed for moderate pain.       Follow-up Information    [facility] [place]. Schedule an appointment as soon as possible for a visit in 2 week(s).   Specialty:  General Surgery Why:  for follow up from your laparoscopic appendectomy. Contact information: [address] [id number] [phone]           Signed: Hosie SpangleElizabeth Simaan, [facility] 02/16/2016, 3:23 PM Pager: [phone] Consults: [phone] Mon-Fri 7:00 am-4:30 pm Sat-Sun 7:00 am-11:30 am

## 2018-02-03 IMAGING — CT CT ABD-PELV W/ CM
2 of 4 series · 12 of 46 positions shown, 14 images · IV contrast (Iodine)
Comparison: 11/27/2012

CLINICAL DATA: Diffuse abdominal pain, nausea, vomiting since 4
a.m..

EXAM:
CT ABDOMEN AND PELVIS WITH CONTRAST
TECHNIQUE: Multidetector CT imaging of the abdomen and pelvis was performed
using the standard protocol following bolus administration of
intravenous contrast.
CONTRAST:  100mL 9UAJOC-VJJ IOPAMIDOL (9UAJOC-VJJ) INJECTION 61%

[Series 201: routine, idose (2) · axial · 0.63mm/px · z∈[-630,-265]mm · 9 of 89 slices shown, 11 images]
[im 8/89  soft-tissue]
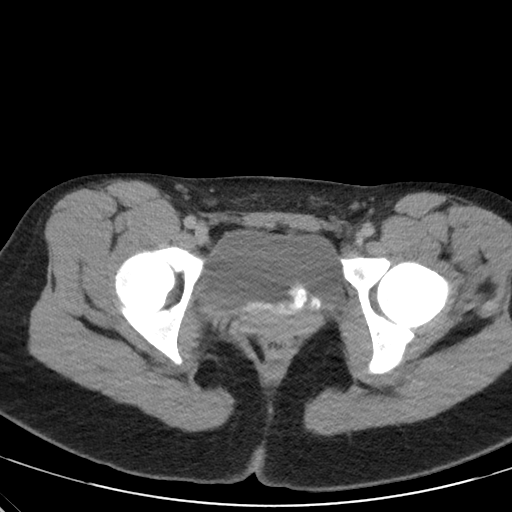
[im 8/89  bone]
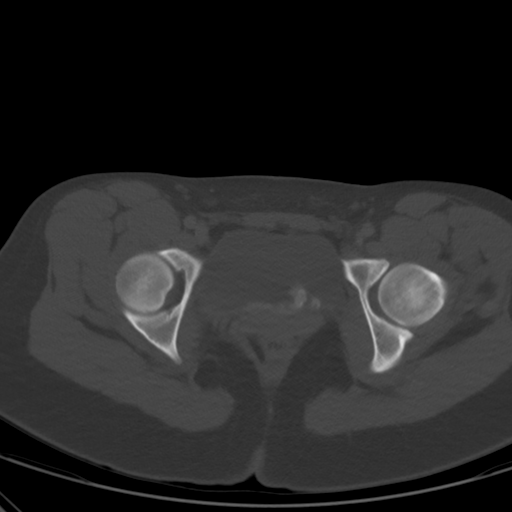
[im 16/89  soft-tissue]
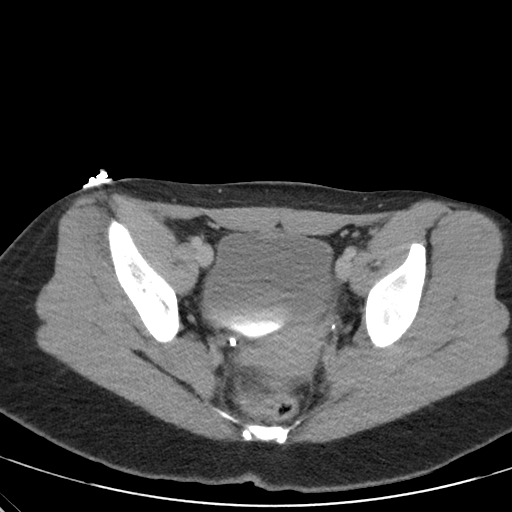
[im 27/89  soft-tissue]
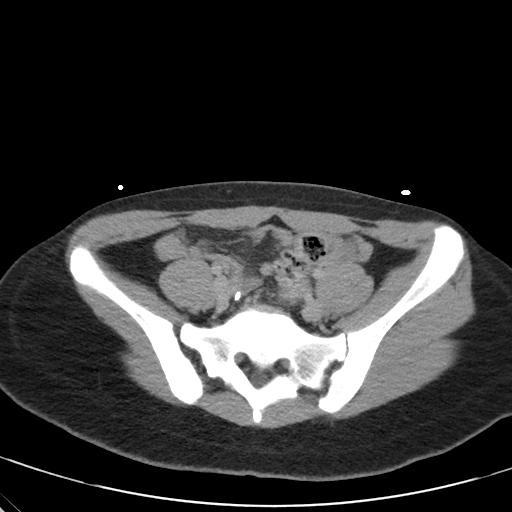
[im 35/89  soft-tissue]
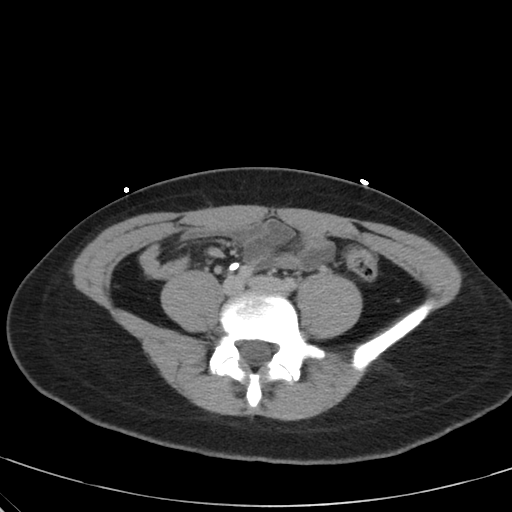
[im 46/89  soft-tissue]
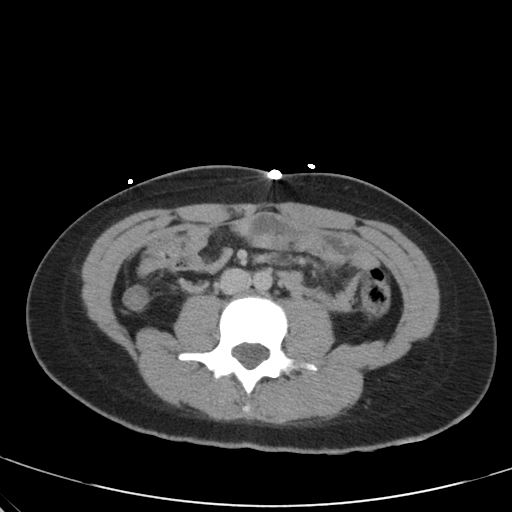
[im 54/89  soft-tissue]
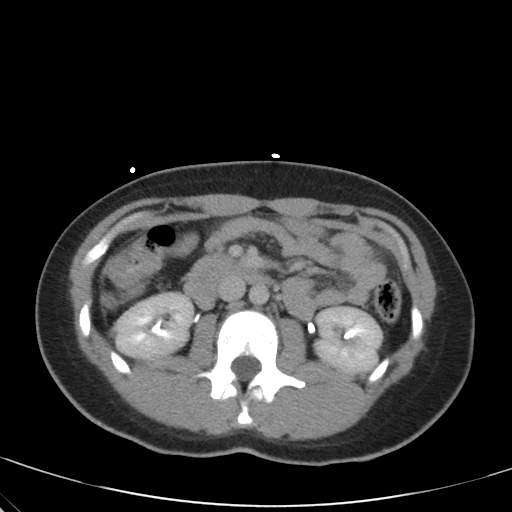
[im 62/89  soft-tissue]
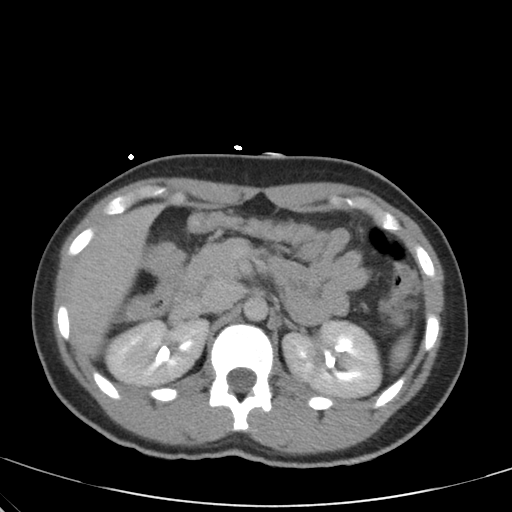
[im 73/89  soft-tissue]
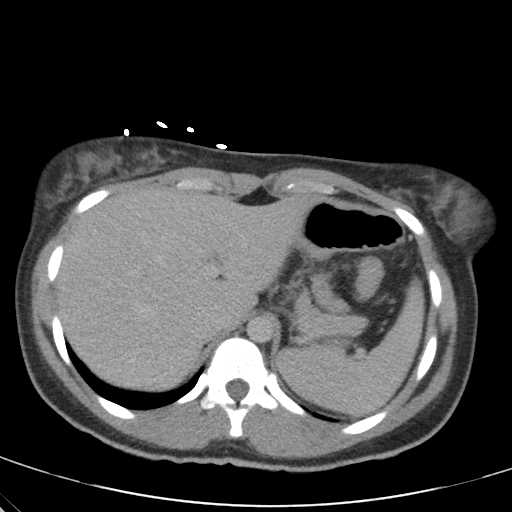
[im 81/89  soft-tissue]
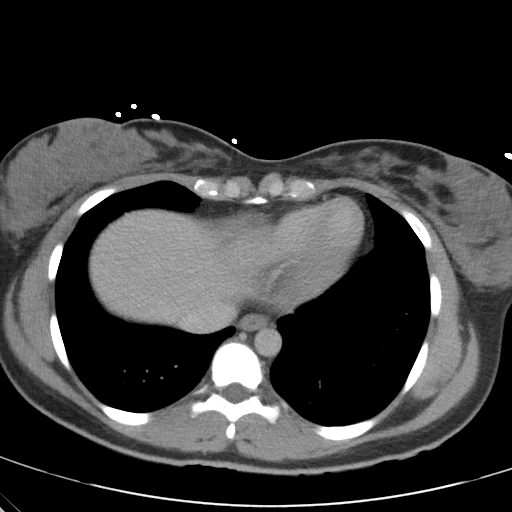
[im 81/89  bone]
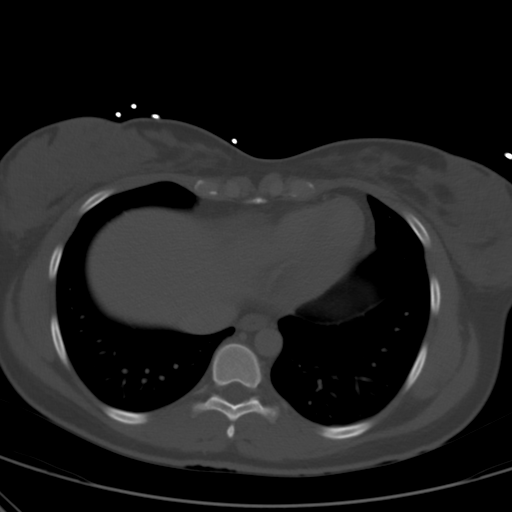

[Series 203: coronals, idose (2) · coronal · 0.45mm/px · 3 of 96 slices shown]
[im 32/96  soft-tissue]
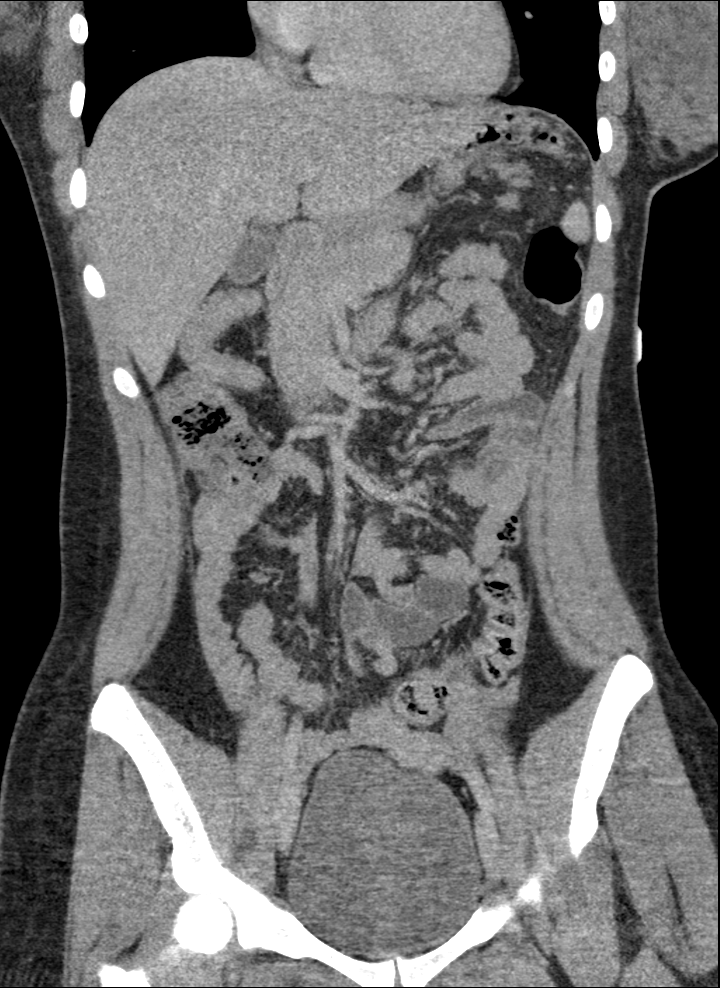
[im 43/96  soft-tissue]
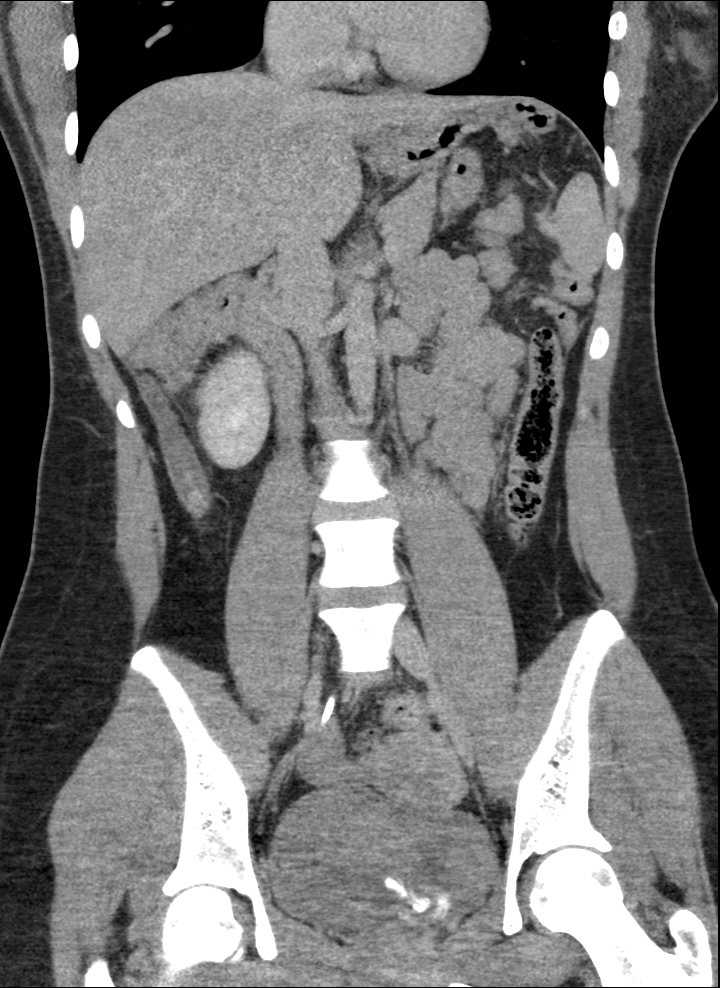
[im 53/96  soft-tissue]
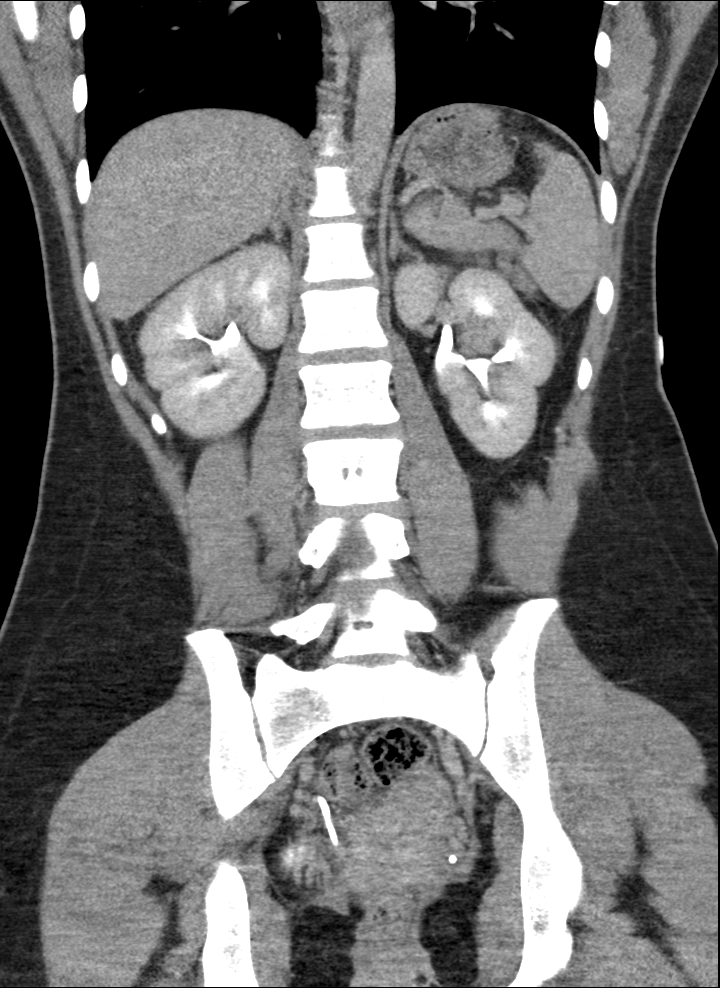

[12 of 46 positions shown; findings below may reference images not displayed]

FINDINGS: Lower chest: Lung bases are clear. No effusions. Heart is normal
size.

Hepatobiliary: No focal hepatic abnormality. Gallbladder
unremarkable.

Pancreas: No focal abnormality or ductal dilatation.

Spleen: No focal abnormality.  Normal size.

Adrenals/Urinary Tract: No adrenal abnormality. No focal renal
abnormality. No stones or hydronephrosis. Urinary bladder is
unremarkable.

Stomach/Bowel: There is a dilated retrocecal appendix. High-density
material noted within the proximal appendix and at the base which
could reflect appendicolith. Appendix 16 mm in diameter. Slight
surrounding inflammatory change. Findings compatible with acute
appendicitis. Stomach, large and small bowel unremarkable.

Vascular/Lymphatic: No evidence of aneurysm or adenopathy.

Reproductive: Uterus and adnexa unremarkable.  No mass.

Other: No free fluid or free air.

Musculoskeletal: No acute bony abnormality or focal bone lesion.
IMPRESSION: Dilated appendix with probable high-density appendicolith is in the
proximal appendix and surrounding inflammatory change. Findings
compatible with acute appendicitis.

These results were called by telephone at the time of interpretation
on 02/15/2016 at [DATE] to LOVIC LIENDO , who verbally acknowledged
these results.

## 2018-12-06 ENCOUNTER — Other Ambulatory Visit: Payer: Self-pay

## 2018-12-06 ENCOUNTER — Encounter (HOSPITAL_COMMUNITY): Payer: Self-pay

## 2018-12-06 ENCOUNTER — Emergency Department (HOSPITAL_COMMUNITY)
Admission: EM | Admit: 2018-12-06 | Discharge: 2018-12-06 | Disposition: A | Discharge status: Home / Self Care | Payer: Self-pay | Attending: Emergency Medicine | Admitting: Emergency Medicine

## 2018-12-06 ENCOUNTER — Emergency Department (HOSPITAL_COMMUNITY): Payer: Self-pay

## 2018-12-06 DIAGNOSIS — N911 Secondary amenorrhea: Secondary | ICD-10-CM | POA: Insufficient documentation

## 2018-12-06 DIAGNOSIS — Z79899 Other long term (current) drug therapy: Secondary | ICD-10-CM | POA: Insufficient documentation

## 2018-12-06 LAB — WET PREP, GENITAL
Sperm: NONE SEEN
Trich, Wet Prep: NONE SEEN
Yeast Wet Prep HPF POC: NONE SEEN

## 2018-12-06 LAB — I-STAT BETA HCG BLOOD, ED (MC, WL, AP ONLY): I-stat hCG, quantitative: <5 m[IU]/mL (ref <5)

## 2018-12-06 NOTE — ED Notes (Signed)
Discharge instructions reviewed with pt. Pt verbalized understanding. Opportunity for questions provided. Pt ambulatory at discharge. Pt left with all belongings.

## 2018-12-06 NOTE — ED Triage Notes (Signed)
Pt stated that she has been having cramps & diarrhea, she is due for a menstrual cycle but is still waiting for it last period in February 2020). She stated that the public health department gave her a urine pregnancy test & it was negative.

## 2018-12-06 NOTE — ED Provider Notes (Signed)
[facility] EMERGENCY DEPARTMENT Provider Note   CSN: [id number] Arrival date & time: 12/06/18  1401     History   Chief Complaint No chief complaint on file.   HPI Theresa Berger is a 28 y.o. female.  Who presents emergency department chief complaint of amenorrhea.  Patient was having normal periods up until February 14 which was her last menstrual.  She has had intermittent pelvic cramping daily since that time.  She denies dyspareunia, breast tenderness, lactation, chronic headaches.  She has taken multiple pregnancy tests which have all been negative.  2 weeks ago she went to the health department and had STD testing which was all negative.     HPI  History reviewed. No pertinent past medical history.  Patient Active Problem List   Diagnosis Date Noted  . Acute appendicitis 02/16/2016  . S/P laparoscopic appendectomy 02/15/2016    Past Surgical History:  Procedure Laterality Date  . LAPAROSCOPIC APPENDECTOMY N/A 02/15/2016   Procedure: APPENDECTOMY LAPAROSCOPIC;  Surgeon: Greer Pickerel, MD;  Location: [facility];  Service: General;  Laterality: N/A;     OB History   No obstetric history on file.      Home Medications    Prior to Admission medications   Medication Sig Start Date End Date Taking? Authorizing Provider  levonorgestrel-ethinyl estradiol (KURVELO) 0.15-30 MG-MCG tablet Take 1 tablet by mouth daily.    [provider]  oxyCODONE (OXY IR/ROXICODONE) 5 MG immediate release tablet Take 1 tablet (5 mg total) by mouth every 4 (four) hours as needed for moderate pain. 02/16/16   Jill Alexanders, PA-C    Family History History reviewed. No pertinent family history.  Social History Social History   Tobacco Use  . Smoking status: Never Smoker  . Smokeless tobacco: Never Used  Substance Use Topics  . Alcohol use: Yes    Comment: occ  . Drug use: No     Allergies   Patient has no known allergies.   Review of Systems  Review of Systems Ten systems reviewed and are negative for acute change, except as noted in the HPI.    Physical Exam Updated Vital Signs BP (!) 141/97   Pulse 84   Temp 98.3 F (36.8 C) (Oral)   Resp 20   Ht 5\' 6"  (1.676 m)   Wt 67.1 kg   SpO2 100%   BMI 23.89 kg/m   Physical Exam Vitals signs and nursing note reviewed. Exam conducted with a chaperone present.  Constitutional:      General: She is not in acute distress.    Appearance: She is well-developed. She is not diaphoretic.  HENT:     Head: Normocephalic and atraumatic.  Eyes:     General: No scleral icterus.    Conjunctiva/sclera: Conjunctivae normal.  Neck:     Musculoskeletal: Normal range of motion.  Cardiovascular:     Rate and Rhythm: Normal rate and regular rhythm.     Heart sounds: Normal heart sounds. No murmur. No friction rub. No gallop.   Pulmonary:     Effort: Pulmonary effort is normal. No respiratory distress.     Breath sounds: Normal breath sounds.  Abdominal:     General: Bowel sounds are normal. There is no distension.     Palpations: Abdomen is soft. There is no mass.     Tenderness: There is no abdominal tenderness. There is no guarding.  Genitourinary:    General: Normal vulva.     Exam position: Lithotomy  position.     Comments: Pelvic exam: normal external genitalia, vulva, vagina, cervix, uterus and adnexa.  Skin:    General: Skin is warm and dry.  Neurological:     Mental Status: She is alert and oriented to person, place, and time.  Psychiatric:        Behavior: Behavior normal.      ED Treatments / Results  Labs (all labs ordered are listed, but only abnormal results are displayed) Labs Reviewed  WET PREP, GENITAL  I-STAT BETA HCG BLOOD, ED ([facility], [facility], [facility] ONLY)    EKG    Radiology No results found.  Procedures Procedures (including critical care time)  Medications Ordered in ED Medications - No data to display   Initial Impression / Assessment and Plan / ED  Course  I have reviewed the triage vital signs and the nursing notes.  Pertinent labs & imaging results that were available during my care of the patient were reviewed by me and considered in my medical decision making (see chart for details).        Patient with negative pregnancy. Wet prep shows clue cells, but she is asymptomatic. No tx needed.  I have given signout to PA Timothy Lassousso who will assume care of the patient PA Roxan HockeyRobinson will assume care of the patient I expect she will need outpatient follow-up for secondary amenorrhea.   Final Clinical Impressions(s) / ED Diagnoses   Final diagnoses:  None    ED Discharge Orders    None       Arthor CaptainHarris, Cristol Engdahl, PA-C 12/07/18 1220    Cathren LaineSteinl, Kevin, MD 12/08/18 1431

## 2018-12-06 NOTE — Discharge Instructions (Addendum)
Your ultrasound is normal today. Follow closely with the ob/gyn

## 2018-12-06 NOTE — ED Provider Notes (Signed)
Care assumed at shift change from ColumbusHarris, PA-C, pending pelvic U/S. See her note for full HPI and workup. Briefly, pt presenting with amenorrhea. Pelvic exam was unremarkable.  Negative pregnancy.  Working diagnosis of secondary amenorrhea.  Plan to follow-up ultrasound result, if negative discharge with outpatient follow-up to women's health.  Physical Exam  BP (!) 141/97   Pulse 84   Temp 98.3 F (36.8 C) (Oral)   Resp 20   Ht 5\' 6"  (1.676 m)   Wt 67.1 kg   SpO2 100%   BMI 23.89 kg/m   Physical Exam Vitals signs and nursing note reviewed.  Constitutional:      General: She is not in acute distress.    Appearance: She is well-developed.  HENT:     Head: Normocephalic and atraumatic.  Eyes:     Conjunctiva/sclera: Conjunctivae normal.  Cardiovascular:     Rate and Rhythm: Normal rate.  Pulmonary:     Effort: Pulmonary effort is normal.  Abdominal:     Palpations: Abdomen is soft.  Skin:    General: Skin is warm.  Neurological:     Mental Status: She is alert.  Psychiatric:        Behavior: Behavior normal.     ED Course/Procedures     Procedures Results for orders placed or performed during the hospital encounter of 12/06/18  Wet prep, genital   Specimen: Genital  Result Value Ref Range   Yeast Wet Prep HPF POC NONE SEEN NONE SEEN   Trich, Wet Prep NONE SEEN NONE SEEN   Clue Cells Wet Prep HPF POC PRESENT (A) NONE SEEN   WBC, Wet Prep HPF POC FEW (A) NONE SEEN   Sperm NONE SEEN   I-Stat beta hCG blood, ED  Result Value Ref Range   I-stat hCG, quantitative <5.0 <5 mIU/mL   Comment 3           [place] Transvaginal Non-ob  Result Date: 12/06/2018 CLINICAL DATA:  Pelvic pain for 6 weeks, amenorrhea EXAM: TRANSABDOMINAL AND TRANSVAGINAL ULTRASOUND OF PELVIS DOPPLER ULTRASOUND OF OVARIES TECHNIQUE: Both transabdominal and transvaginal ultrasound examinations of the pelvis were performed. Transabdominal technique was performed for global imaging of the pelvis including  uterus, ovaries, adnexal regions, and pelvic cul-de-sac. It was necessary to proceed with endovaginal exam following the transabdominal exam to visualize the endometrium. Color and duplex Doppler ultrasound was utilized to evaluate blood flow to the ovaries. COMPARISON:  None FINDINGS: Uterus Measurements: 7.4 x 3.8 x 4.5 cm = volume: 67 mL. Normal morphology without mass Endometrium Thickness: 2 mm, normal. Normal thin appearance without endometrial fluid or focal abnormality Right ovary Measurements: 3.4 x 2.7 x 2.8 cm = volume: 13.3 mL. Normal morphology without mass period internal blood flow present on color Doppler imaging. Left ovary Measurements: 3.1 x 2.0 x 1.7 cm = volume: 5.6 mL. Normal morphology without mass period internal blood flow present on color Doppler imaging. Pulsed Doppler evaluation of both ovaries demonstrates normal low-resistance arterial and venous waveforms. No evidence of ovarian torsion. Other findings Trace free pelvic fluid.  No adnexal masses. IMPRESSION: Normal exam. Electronically Signed   By: Ulyses SouthwardMark  Boles M.D.   On: 12/06/2018 16:40   [place]as Pelvis Complete  Result Date: 12/06/2018 CLINICAL DATA:  Pelvic pain for 6 weeks, amenorrhea EXAM: TRANSABDOMINAL AND TRANSVAGINAL ULTRASOUND OF PELVIS DOPPLER ULTRASOUND OF OVARIES TECHNIQUE: Both transabdominal and transvaginal ultrasound examinations of the pelvis were performed. Transabdominal technique was performed for global imaging of the pelvis including uterus,  ovaries, adnexal regions, and pelvic cul-de-sac. It was necessary to proceed with endovaginal exam following the transabdominal exam to visualize the endometrium. Color and duplex Doppler ultrasound was utilized to evaluate blood flow to the ovaries. COMPARISON:  None FINDINGS: Uterus Measurements: 7.4 x 3.8 x 4.5 cm = volume: 67 mL. Normal morphology without mass Endometrium Thickness: 2 mm, normal. Normal thin appearance without endometrial fluid or focal abnormality Right  ovary Measurements: 3.4 x 2.7 x 2.8 cm = volume: 13.3 mL. Normal morphology without mass period internal blood flow present on color Doppler imaging. Left ovary Measurements: 3.1 x 2.0 x 1.7 cm = volume: 5.6 mL. Normal morphology without mass period internal blood flow present on color Doppler imaging. Pulsed Doppler evaluation of both ovaries demonstrates normal low-resistance arterial and venous waveforms. No evidence of ovarian torsion. Other findings Trace free pelvic fluid.  No adnexal masses. IMPRESSION: Normal exam. Electronically Signed   By: Lavonia Dana M.D.   On: 12/06/2018 16:40   [place] Art/ven Flow Abd Pelv Doppler  Result Date: 12/06/2018 CLINICAL DATA:  Pelvic pain for 6 weeks, amenorrhea EXAM: TRANSABDOMINAL AND TRANSVAGINAL ULTRASOUND OF PELVIS DOPPLER ULTRASOUND OF OVARIES TECHNIQUE: Both transabdominal and transvaginal ultrasound examinations of the pelvis were performed. Transabdominal technique was performed for global imaging of the pelvis including uterus, ovaries, adnexal regions, and pelvic cul-de-sac. It was necessary to proceed with endovaginal exam following the transabdominal exam to visualize the endometrium. Color and duplex Doppler ultrasound was utilized to evaluate blood flow to the ovaries. COMPARISON:  None FINDINGS: Uterus Measurements: 7.4 x 3.8 x 4.5 cm = volume: 67 mL. Normal morphology without mass Endometrium Thickness: 2 mm, normal. Normal thin appearance without endometrial fluid or focal abnormality Right ovary Measurements: 3.4 x 2.7 x 2.8 cm = volume: 13.3 mL. Normal morphology without mass period internal blood flow present on color Doppler imaging. Left ovary Measurements: 3.1 x 2.0 x 1.7 cm = volume: 5.6 mL. Normal morphology without mass period internal blood flow present on color Doppler imaging. Pulsed Doppler evaluation of both ovaries demonstrates normal low-resistance arterial and venous waveforms. No evidence of ovarian torsion. Other findings Trace free  pelvic fluid.  No adnexal masses. IMPRESSION: Normal exam. Electronically Signed   By: Lavonia Dana M.D.   On: 12/06/2018 16:40    MDM  Pelvic ultrasound is negative.  Patient discharged per plan with outpatient follow-up to women's health.  Patient has no complaints and is agreeable to discharge.  Discussed results, findings, treatment and follow up. Patient advised of return precautions. Patient verbalized understanding and agreed with plan.        , [place] N, PA-C 12/06/18 1729    Lajean Saver, MD 12/08/18 1430

## 2019-01-16 ENCOUNTER — Encounter: Payer: Self-pay | Admitting: Certified Nurse Midwife

## 2019-01-16 ENCOUNTER — Other Ambulatory Visit: Payer: Self-pay

## 2019-01-16 ENCOUNTER — Ambulatory Visit (INDEPENDENT_AMBULATORY_CARE_PROVIDER_SITE_OTHER): Payer: Self-pay | Admitting: Certified Nurse Midwife

## 2019-01-16 VITALS — BP 127/82 | HR 93 | Temp 97.8°F

## 2019-01-16 DIAGNOSIS — N911 Secondary amenorrhea: Secondary | ICD-10-CM

## 2019-01-16 LAB — POCT PREGNANCY, URINE: Preg Test, Ur: NEGATIVE

## 2019-01-16 MED ORDER — MEDROXYPROGESTERONE ACETATE 10 MG PO TABS: 10.0000 mg | ORAL_TABLET | Freq: Every day | ORAL | 0 refills | Status: DC

## 2019-01-16 NOTE — Progress Notes (Signed)
   GYNECOLOGY OFFICE VISIT NOTE  History:  28 y.o. G0 here today for amenorrhea. Reports last menses 5 mos ago. Is not using contraception, would like to become pregnant. Used OCPs and Depo Provera several years ago.She denies any abnormal vaginal discharge, bleeding, pelvic pain or other concerns. She has tried herbal supplements in the past when she missed periods and it helped but not recently.   No past medical history on file.  Past Surgical History:  Procedure Laterality Date  . LAPAROSCOPIC APPENDECTOMY N/A 02/15/2016   Procedure: APPENDECTOMY LAPAROSCOPIC;  Surgeon: Greer Pickerel, MD;  Location: [facility];  Service: General;  Laterality: N/A;   The following portions of the patient's history were reviewed and updated as appropriate: allergies, current medications, past family history, past medical history, past social history, past surgical history and problem list.   Health Maintenance:  Normal pap few mos ago at [facility].    Review of Systems:  Genito-Urinary ROS:  No vaginal discharge No dyspareunia No weight changes  Objective:  Physical Exam BP 127/82 (BP Location: Left Arm)   Pulse 93   Temp 97.8 F (36.6 C)   LMP 08/10/2018  CONSTITUTIONAL: Well-developed, well-nourished female in no acute distress.  HENT:  Normocephalic, atraumatic EYES: Conjunctivae and EOM are normal NECK: Normal range of motion SKIN: Skin is warm and dry NEUROLOGIC: Alert and oriented to person, place, and time PSYCHIATRIC: Normal mood and affect CARDIOVASCULAR: Normal heart rate noted RESPIRATORY: Effort and rate normal ABDOMEN: Soft, no distention  PELVIC: Normal appearing external genitalia; normal appearing vaginal mucosa and cervix.  No abnormal discharge noted.  Normal uterine size, no other palpable masses, no uterine or adnexal tenderness. MUSCULOSKELETAL: Normal range of motion  Labs and Imaging Results for orders placed or performed in visit on 01/16/19 (from the past 24 hour(s))   Pregnancy, urine POC     Status: None   Collection Time: 01/16/19  3:52 PM  Result Value Ref Range   Preg Test, Ur NEGATIVE NEGATIVE   Assessment & Plan:   1. Secondary amenorrhea    Orders Placed This Encounter  Procedures  . TSH  . FSH  . Prolactin  . Estradiol  . B-HCG Quant  . Pregnancy, urine POC   Rx Provera Follow up in 2 mos   Total face-to-face time with patient: 30 minutes   Julianne Handler, [place] 01/16/2019 4:20 PM

## 2019-01-17 LAB — FOLLICLE STIMULATING HORMONE: FSH: 5.1 m[IU]/mL

## 2019-01-17 LAB — ESTRADIOL: Estradiol: 56.6 pg/mL

## 2019-01-17 LAB — BETA HCG QUANT (REF LAB): hCG Quant: <1 m[IU]/mL

## 2019-01-17 LAB — PROLACTIN: Prolactin: 18.8 ng/mL (ref 4.8–23.3)

## 2019-01-17 LAB — TSH: TSH: 0.767 u[IU]/mL (ref 0.450–4.500)

## 2019-03-13 ENCOUNTER — Telehealth: Payer: Self-pay | Admitting: Medical

## 2019-03-14 ENCOUNTER — Other Ambulatory Visit: Payer: Self-pay

## 2019-03-14 ENCOUNTER — Encounter: Payer: Self-pay | Admitting: Obstetrics & Gynecology

## 2019-03-14 NOTE — Progress Notes (Signed)
Called pt @ 1345 and LM that I am calling regards to her appt.  If she could please give the office a call back.  I will give her a call back.  Called pt @ 1413 and LM that this is my second attempt in trying to reach her if she could please call the office to reschedule her appt.   Mel Almond, RN 03/14/19

## 2019-04-04 ENCOUNTER — Other Ambulatory Visit: Payer: Self-pay

## 2019-04-04 ENCOUNTER — Encounter: Payer: Self-pay | Admitting: Obstetrics & Gynecology

## 2019-04-04 ENCOUNTER — Telehealth (INDEPENDENT_AMBULATORY_CARE_PROVIDER_SITE_OTHER): Payer: Self-pay | Admitting: Obstetrics & Gynecology

## 2019-04-04 DIAGNOSIS — N913 Primary oligomenorrhea: Secondary | ICD-10-CM

## 2019-04-04 NOTE — Progress Notes (Signed)
I connected with  Theresa Berger on 04/04/19 at  1:35 PM EDT by telephone and verified that I am speaking with the correct person using two identifiers.   I discussed the limitations, risks, security and privacy concerns of performing an evaluation and management service by telephone and the availability of in person appointments. I also discussed with the patient that there may be a patient responsible charge related to this service. The patient expressed understanding and agreed to proceed.  [place], CMA 04/04/2019  1:21 PM   Got her menstrual back since the provera 01/30/2019 lasted 02/04/2019 Stated back taking herbal vitamins (Agnus-Castus)  and her menstrual came 03/04/2019 -03/11/2019 And hasn't had one yet this month waiting on the next one.

## 2019-04-04 NOTE — Progress Notes (Signed)
    TELEHEALTH GYNECOLOGY VIRTUAL VIDEO VISIT ENCOUNTER NOTE  Provider location: [facility] at [place]   I connected with Theresa Berger on 04/04/19 at  1:35 PM EDT by MyChart Video Encounter at home and verified that I am speaking with the correct person using two identifiers.   I discussed the limitations, risks, security and privacy concerns of performing an evaluation and management service virtually and the availability of in person appointments. I also discussed with the patient that there may be a patient responsible charge related to this service. The patient expressed understanding and agreed to proceed.   History:  Theresa Berger is a 28 y.o. G0 here today for f/u of primary oligomenorrhea. Cycle length ~33 days. She was seen for amenorrhea for 4 months. She was seen at the [facility] and had a Provera challenge. She has had monthly cycles for the past 2 months. Pt has been sexually active for 1 year with no cycle.  Partner has no children. Pt has never had a dx'd pregnancy. Pt was prev on Depo Provera but, has been off for 3 years.  Has not been on contraception for the past 3 years and has been sexually active.   Pt does relate molimina sx with menses these past 2 months.    No past medical history on file. Past Surgical History:  Procedure Laterality Date  . LAPAROSCOPIC APPENDECTOMY N/A 02/15/2016   Procedure: APPENDECTOMY LAPAROSCOPIC;  Surgeon: Greer Pickerel, MD;  Location: [facility];  Service: General;  Laterality: N/A;   The following portions of the patient's history were reviewed and updated as appropriate: allergies, current medications, past family history, past medical history, past social history, past surgical history and problem list.    Review of Systems:  Pertinent items noted in HPI and remainder of comprehensive ROS otherwise negative.  Physical Exam:   General:  Alert, oriented and cooperative. Patient appears to be in no acute distress.  Mental  Status: Normal mood and affect. Normal behavior. Normal judgment and thought content.   Respiratory: Normal respiratory effort, no problems with respiration noted  Rest of physical exam deferred due to type of encounter    Assessment and Plan:     Oligomenorrhea- improved now.    F/u 3 months to review cycles Begin PNV        I discussed the assessment and treatment plan with the patient. The patient was provided an opportunity to ask questions and all were answered. The patient agreed with the plan and demonstrated an understanding of the instructions.   The patient was advised to call back or seek an in-person evaluation/go to the ED if the symptoms worsen or if the condition fails to improve as anticipated.  I provided 16 minutes of face-to-face time during this encounter.   Lavonia Drafts, MD [facility], [place]

## 2019-05-07 ENCOUNTER — Telehealth: Payer: Self-pay | Admitting: General Practice

## 2019-05-07 DIAGNOSIS — B9689 Other specified bacterial agents as the cause of diseases classified elsewhere: Secondary | ICD-10-CM

## 2019-05-07 MED ORDER — METRONIDAZOLE 500 MG PO TABS: 500.0000 mg | ORAL_TABLET | Freq: Two times a day (BID) | ORAL | 0 refills | Status: DC

## 2019-05-07 NOTE — Telephone Encounter (Signed)
Per Dr Ihor Dow regarding mychart message, Please call pt. The boric acid is good for suppression. That is what we recommend. It is not a prescription.   Re ovulation indxn. Is she trying to get pregnant? She will need an appt to discuss getting on Clomid or Letrazole.   Called patient to discuss. Patient states she is very much wanting to get pregnant and has been trying to for a while. Discussed with patient she will need to come in and talk with Dr Ihor Dow regarding that. Patient states they have kind of already talked about it and she doesn't want another appt that isn't going to go anywhere. Discussed with patient if she is wanting to get placed on medication there are specific things the doctor will want to discuss regarding that such as timing, when to take, ovulation tracking etc. Told patient this is the next step she would need to take regarding infertility assistance etc. Discussed that an infertility specialist will have wanted her to try these specific things. Patient verbalized understanding.  Discussed use of boric acid with her and that is what we use/recommend for suppression but it doesn't require a prescription. Patient states she has been taking it daily for over a month now and is still having issues. She reports discharge with odor. Discussed sending Flagyl in per protocol. Also discussed ways to decrease incidence of BV as well and advised that she may want to use condoms for the next couple of weeks just so her body can establish a good pH balance. Patient verbalized understanding & will await phone call from front office for an appt with Dr Ihor Dow.

## 2019-06-06 ENCOUNTER — Encounter: Payer: Self-pay | Admitting: Family Medicine

## 2019-06-06 ENCOUNTER — Telehealth (INDEPENDENT_AMBULATORY_CARE_PROVIDER_SITE_OTHER): Payer: Self-pay | Admitting: Family Medicine

## 2019-06-06 DIAGNOSIS — E282 Polycystic ovarian syndrome: Secondary | ICD-10-CM

## 2019-06-06 DIAGNOSIS — Z3169 Encounter for other general counseling and advice on procreation: Secondary | ICD-10-CM

## 2019-06-06 DIAGNOSIS — N911 Secondary amenorrhea: Secondary | ICD-10-CM | POA: Insufficient documentation

## 2019-06-06 MED ORDER — CLOMIPHENE CITRATE 50 MG PO TABS
50.0000 mg | ORAL_TABLET | Freq: Every day | ORAL | 1 refills | Status: DC
Start: 2019-06-06 — End: 2020-07-15

## 2019-06-06 MED ORDER — MEDROXYPROGESTERONE ACETATE 5 MG PO TABS: 5.0000 mg | ORAL_TABLET | Freq: Every day | ORAL | 0 refills | Status: DC

## 2019-06-06 MED ORDER — CONCEPT OB 130-92.4-1 MG PO CAPS: 1.0000 | ORAL_CAPSULE | Freq: Every day | ORAL | 3 refills | Status: AC

## 2019-06-06 NOTE — Progress Notes (Signed)
I connected with  Theresa Berger on 06/06/19 at 11:15 AM EST by MyChart and verified that I am speaking with the correct person using two identifiers.   I discussed the limitations, risks, security and privacy concerns of performing an evaluation and management service by telephone and the availability of in person appointments. I also discussed with the patient that there may be a patient responsible charge related to this service. The patient expressed understanding and agreed to proceed.  Annabell Howells, RN 06/06/2019  11:12 AM

## 2019-06-06 NOTE — Assessment & Plan Note (Signed)
Provera to begin menses, clomid day 5-9, timed intercourse day 10-20, at day 30, repeat pregnancy test and if negative repeat provera and repeat cycle.

## 2019-06-06 NOTE — Progress Notes (Signed)
    TELEHEALTH GYNECOLOGY VIRTUAL VIDEO VISIT ENCOUNTER NOTE  Provider location: [facility] at [place]   I connected with Theresa Berger on 06/06/19 at 11:15 AM EST by MyChart Video Encounter at home and verified that I am speaking with the correct person using two identifiers.   I discussed the limitations, risks, security and privacy concerns of performing an evaluation and management service virtually and the availability of in person appointments. I also discussed with the patient that there may be a patient responsible charge related to this service. The patient expressed understanding and agreed to proceed.   History:  Theresa Berger is a 28 y.o. No obstetric history on file. female being evaluated today for infertility. Reports usually several months between cycles. Can usually have  A cycle 3x/year. Usually has some months with cycle 2x/month. She denies any abnormal vaginal discharge, bleeding, pelvic pain or other concerns.  She is actively trying to conceive.     No past medical history on file. Past Surgical History:  Procedure Laterality Date  . LAPAROSCOPIC APPENDECTOMY N/A 02/15/2016   Procedure: APPENDECTOMY LAPAROSCOPIC;  Surgeon: Greer Pickerel, MD;  Location: [facility];  Service: General;  Laterality: N/A;   The following portions of the patient's history were reviewed and updated as appropriate: allergies, current medications, past family history, past medical history, past social history, past surgical history and problem list.   Review of Systems:  Pertinent items noted in HPI and remainder of comprehensive ROS otherwise negative.  Physical Exam:   General:  Alert, oriented and cooperative. Patient appears to be in no acute distress.  Mental Status: Normal mood and affect. Normal behavior. Normal judgment and thought content.   Respiratory: Normal respiratory effort, no problems with respiration noted  Rest of physical exam deferred due to type of  encounter    Assessment and Plan:     Problem List Items Addressed This Visit      Unprioritized   Infertility counseling    Provera to begin menses, clomid day 5-9, timed intercourse day 10-20, at day 30, repeat pregnancy test and if negative repeat provera and repeat cycle.      Relevant Medications   clomiPHENE (CLOMID) 50 MG tablet   Secondary amenorrhea - Primary   Relevant Medications   medroxyPROGESTERone (PROVERA) 5 MG tablet   Prenat w/o A Vit-FeFum-FePo-FA (CONCEPT OB) 130-92.4-1 MG CAPS   PCOS (polycystic ovarian syndrome)    Lengthy discussion had with pt. to explain PCOS, diagrams and verbal communication used to show impact on ovaries, endometrium, need for endometrial protection, impact on fertility, medical treatments to achieve necessary endpoints.  Should be on cyclical progestin vs. OC's if not trying to get pregnant.  Should be on Femara or clomid if trying to conceive.          I discussed the assessment and treatment plan with the patient. The patient was provided an opportunity to ask questions and all were answered. The patient agreed with the plan and demonstrated an understanding of the instructions.   The patient was advised to call back or seek an in-person evaluation/go to the ED if the symptoms worsen or if the condition fails to improve as anticipated.  I provided 30 minutes of face-to-face time during this encounter.   Donnamae Jude, MD [facility], [place]

## 2019-06-06 NOTE — Assessment & Plan Note (Signed)
Lengthy discussion had with pt. to explain PCOS, diagrams and verbal communication used to show impact on ovaries, endometrium, need for endometrial protection, impact on fertility, medical treatments to achieve necessary endpoints.  Should be on cyclical progestin vs. OC's if not trying to get pregnant.  Should be on Femara or clomid if trying to conceive.  

## 2019-08-27 ENCOUNTER — Other Ambulatory Visit: Payer: Self-pay | Admitting: Family Medicine

## 2019-08-27 DIAGNOSIS — N911 Secondary amenorrhea: Secondary | ICD-10-CM

## 2019-09-25 ENCOUNTER — Telehealth (INDEPENDENT_AMBULATORY_CARE_PROVIDER_SITE_OTHER): Payer: Self-pay | Admitting: Family Medicine

## 2019-09-25 DIAGNOSIS — N979 Female infertility, unspecified: Secondary | ICD-10-CM

## 2019-09-25 DIAGNOSIS — Z3169 Encounter for other general counseling and advice on procreation: Secondary | ICD-10-CM

## 2019-09-25 DIAGNOSIS — N911 Secondary amenorrhea: Secondary | ICD-10-CM

## 2019-09-25 DIAGNOSIS — E282 Polycystic ovarian syndrome: Secondary | ICD-10-CM

## 2019-09-25 NOTE — Progress Notes (Signed)
I connected with  Theresa Berger on 09/25/19 at 10:55 AM EDT by telephone and verified that I am speaking with the correct person using two identifiers.   I discussed the limitations, risks, security and privacy concerns of performing an evaluation and management service by telephone and the availability of in person appointments. I also discussed with the patient that there may be a patient responsible charge related to this service. The patient expressed understanding and agreed to proceed.  [name], CMA 09/25/2019  10:56 AM

## 2019-09-25 NOTE — Progress Notes (Signed)
     GYNECOLOGY VIRTUAL VISIT ENCOUNTER NOTE  Provider location: [facility]   I connected with Theresa Berger on 09/25/19 at 10:55 AM EDT by MyChart Video Encounter at home and verified that I am speaking with the correct person using two identifiers.   I discussed the limitations, risks, security and privacy concerns of performing an evaluation and management service virtually and the availability of in person appointments. I also discussed with the patient that there may be a patient responsible charge related to this service. The patient expressed understanding and agreed to proceed.   History:  Theresa Berger is a 29 y.o. No obstetric history on file. female being evaluated today for Fertility concerns. Provera given and she is now having regular cycles. She took clomid for 2 cycles and has now stopped and had a regular cycle without taking it. She is working on relationship issues while not actively trying to prevent pregnancy and not aggressively trying to achieve pregnancy. She denies any abnormal vaginal discharge, bleeding, pelvic pain or other concerns.       No past medical history on file. Past Surgical History:  Procedure Laterality Date  . LAPAROSCOPIC APPENDECTOMY N/A 02/15/2016   Procedure: APPENDECTOMY LAPAROSCOPIC;  Surgeon: Gaynelle Adu, MD;  Location: [facility] OR;  Service: General;  Laterality: N/A;   The following portions of the patient's history were reviewed and updated as appropriate: allergies, current medications, past family history, past medical history, past social history, past surgical history and problem list.   Health Maintenance:  Normal pap and negative HRHPV on 07/2019 at [facility].   Review of Systems:  Pertinent items noted in HPI and remainder of comprehensive ROS otherwise negative.  Physical Exam:   General:  Alert, oriented and cooperative. Patient appears to be in no acute distress.  Mental Status: Normal mood and affect.  Normal behavior. Normal judgment and thought content.   Respiratory: Normal respiratory effort, no problems with respiration noted  Rest of physical exam deferred due to type of encounter  Labs and Imaging No results found for this or any previous visit (from the past 336 hour(s)). No results found.     Assessment and Plan:     1. PCOS (polycystic ovarian syndrome) Now having normal cycles--has provera if they are not coming regularly  2. Infertility Thinks she can get Femara at [facility] for cheaper--may send MyChart message Healthy lifestyle while trying to achieve pregnancy. Timing of intercourse, ovulation prediction. Should only need Clomid or Femara if not having regular cycles.        I discussed the assessment and treatment plan with the patient. The patient was provided an opportunity to ask questions and all were answered. The patient agreed with the plan and demonstrated an understanding of the instructions.   The patient was advised to call back or seek an in-person evaluation/go to the ED if the symptoms worsen or if the condition fails to improve as anticipated.  I provided 12 minutes of face-to-face time during this encounter. Total time 21 minutes.   Reva Bores, MD [facility] [employer]

## 2019-10-16 ENCOUNTER — Other Ambulatory Visit: Payer: Self-pay

## 2019-10-16 ENCOUNTER — Ambulatory Visit (INDEPENDENT_AMBULATORY_CARE_PROVIDER_SITE_OTHER): Payer: Self-pay | Admitting: Plastic Surgery

## 2019-10-16 ENCOUNTER — Encounter: Payer: Self-pay | Admitting: Plastic Surgery

## 2019-10-16 VITALS — BP 141/87 | HR 87 | Temp 98.0°F | Ht 66.0 in | Wt 155.0 lb

## 2019-10-16 DIAGNOSIS — L910 Hypertrophic scar: Secondary | ICD-10-CM

## 2019-10-16 NOTE — Progress Notes (Signed)
Referring Provider Clyda Hurdle [facility] [address] [place],  [place] [address]   CC:  Chief Complaint  Patient presents with  . Advice Only    for keloid scar (back and under belly button)      Theresa Berger is an 29 y.o. female.  HPI: Patient is here to discuss excision of keloids on her right shoulder and just below her umbilicus.  She had a pimple on her back that grew to a large keloid.  Is been present for years and really bothers her.  It has not been injected with steroids as her previous provider did not feel it would help.  She also has a smaller keloid below her umbilicus from a laparoscopic appendectomy.  This 1 has been injected with steroids and got a little bit smaller but not small enough for her satisfaction.  She is interested in having them both excised.  No Known Allergies  Outpatient Encounter Medications as of 10/16/2019  Medication Sig  . clomiPHENE (CLOMID) 50 MG tablet Take 1 tablet (50 mg total) by mouth daily. Take day 5-9 (Patient not taking: Reported on 10/16/2019)  . medroxyPROGESTERone (PROVERA) 5 MG tablet TAKE 1 TABLET (5 MG TOTAL) BY MOUTH DAILY. TAKE FOR 5 DAYS TO INDUCE A CYCLE (Patient not taking: Reported on 10/16/2019)  . Prenat w/o A Vit-FeFum-FePo-FA (CONCEPT OB) 130-92.4-1 MG CAPS Take 1 capsule by mouth daily. (Patient not taking: Reported on 10/16/2019)   No facility-administered encounter medications on file as of 10/16/2019.     No past medical history on file.  Past Surgical History:  Procedure Laterality Date  . LAPAROSCOPIC APPENDECTOMY N/A 02/15/2016   Procedure: APPENDECTOMY LAPAROSCOPIC;  Surgeon: Gaynelle Adu, MD;  Location: [facility] OR;  Service: General;  Laterality: N/A;    No family history on file.  Social History   Social History Narrative  . Not on file     Review of Systems General: Denies fevers, chills, weight loss CV: Denies chest pain, shortness of breath, palpitations  Physical  Exam Vitals with BMI 10/16/2019 01/16/2019 12/06/2018  Height 5\' 6"  - 5\' 6"   Weight 155 lbs - 148 lbs  BMI 25.03 - 23.9  Systolic 141 127 -  Diastolic 87 82 -  Pulse 87 93 -    General:  No acute distress,  Alert and oriented, Non-Toxic, Normal speech and affect Back: She has a 3 to 4 cm diameter circular keloid in the right posterior shoulder area.  There is no surrounding scars or worrisome skin lesions. Abdomen: She has 2 to 3 cm diameter keloid at the inferior aspect of her umbilicus.  It is softer and a bit pedunculated.  Assessment/Plan Patient has 2 symptomatic keloids that she would like excised.  I discussed the risk of excision that include bleeding, infection, demonstrating structures, need for additional procedures.  Had an extensive conversation with her regarding the details of excising the 1 on her shoulder.  I explained that this is typically an area that is bad for scars and that there is a chance of keloid recurrence.  I explained that it could come back worse than it started.  I think it is a bit less risky to excise the one from her umbilicus.  She is fed up with these and wants both of them excised at all cost.  We will plan to do this under local in the office.  All of her questions were answered.  10/16/2019, 1:09 PM

## 2019-11-20 ENCOUNTER — Ambulatory Visit (INDEPENDENT_AMBULATORY_CARE_PROVIDER_SITE_OTHER): Payer: BC Managed Care – PPO | Admitting: Plastic Surgery

## 2019-11-20 ENCOUNTER — Encounter: Payer: Self-pay | Admitting: Plastic Surgery

## 2019-11-20 ENCOUNTER — Other Ambulatory Visit: Payer: Self-pay

## 2019-11-20 VITALS — BP 122/80 | HR 65 | Temp 98.2°F | Ht 66.0 in | Wt 154.0 lb

## 2019-11-20 DIAGNOSIS — L919 Hypertrophic disorder of the skin, unspecified: Secondary | ICD-10-CM | POA: Diagnosis not present

## 2019-11-20 DIAGNOSIS — Z411 Encounter for cosmetic surgery: Secondary | ICD-10-CM

## 2019-11-20 MED ORDER — HYDROCODONE-ACETAMINOPHEN 5-325 MG PO TABS: 1.0000 | ORAL_TABLET | Freq: Four times a day (QID) | ORAL | 0 refills | Status: DC | PRN

## 2019-11-20 NOTE — Addendum Note (Signed)
Addended by: [name] on: 11/20/2019 03:01 PM   Modules accepted: Orders

## 2019-11-20 NOTE — Progress Notes (Signed)
Operative Note   DATE OF OPERATION: 11/20/2019  LOCATION:    SURGICAL DEPARTMENT: Plastic Surgery  PREOPERATIVE DIAGNOSES: Keloids of right shoulder, infraumbilical, and suprapubic  POSTOPERATIVE DIAGNOSES:  same  PROCEDURE:  1. Excision of right shoulder and infraumbilical keloids measuring 10 and 4 cm respectively 2. Complex closure of both areas measuring 10 and 4 cm respectively 3. Steroid injection to suprapubic keloid  SURGEON: Ancil Linsey, MD  ANESTHESIA:  Local  COMPLICATIONS: None.   INDICATIONS FOR PROCEDURE:  The patient, Theresa Berger is a 29 y.o. female born on [birth year], is here for treatment of multiple keloids MRN: [id number]  CONSENT:  Informed consent was obtained directly from the patient. Risks, benefits and alternatives were fully discussed. Specific risks including but not limited to bleeding, infection, hematoma, seroma, scarring, pain, infection, wound healing problems, and need for further surgery were all discussed. The patient did have an ample opportunity to have questions answered to satisfaction.   DESCRIPTION OF PROCEDURE:  Local anesthesia was administered. The patient's operative site was prepped and draped in a sterile fashion. A time out was performed and all information was confirmed to be correct.  The lesions were excised with a 15 blade.  Hemostasis was obtained.  Circumferential undermining was performed and the skin was advanced and closed in layers with interrupted buried Monocryl sutures and Monocryl and fast gut for the skin.  The supraumbilical keloid was then injected with a mixture of 0.5 cc of Kenalog 40 and 0.5 cc of lidocaine with epinephrine.  The patient tolerated the procedure well.  There were no complications.

## 2019-12-04 ENCOUNTER — Ambulatory Visit (INDEPENDENT_AMBULATORY_CARE_PROVIDER_SITE_OTHER): Payer: BC Managed Care – PPO | Admitting: Plastic Surgery

## 2019-12-04 ENCOUNTER — Encounter: Payer: Self-pay | Admitting: Plastic Surgery

## 2019-12-04 ENCOUNTER — Other Ambulatory Visit: Payer: Self-pay

## 2019-12-04 VITALS — BP 105/67 | HR 78 | Temp 97.5°F

## 2019-12-04 DIAGNOSIS — L910 Hypertrophic scar: Secondary | ICD-10-CM | POA: Diagnosis not present

## 2019-12-04 NOTE — Progress Notes (Signed)
° °  Referring Provider No referring provider defined for this encounter.   CC:  Chief Complaint  Patient presents with   Follow-up      Theresa Berger is an 29 y.o. female.  HPI: Patient is here 2-week follow-up after excision of keloid on the right shoulder in the inferior umbilicus area.  She is doing great.  Everything seems to be healing well from her's perspective.  Review of Systems General: Denies denies fevers and chills  Physical Exam Vitals with BMI 12/04/2019 11/20/2019 10/16/2019  Height - 5\' 6"  5\' 6"   Weight - 154 lbs 155 lbs  BMI - 24.87 25.03  Systolic 105 122  Diastolic 67 80 87  Pulse 78 65 87    General:  No acute distress,  Alert and oriented, Non-Toxic, Normal speech and affect On exam both areas are healing fine.  All the sutures have dissolved and fallen out at this point.  There is no sign of keloid recurrence at this stage.  Assessment/Plan Overall she is doing great I explained that if she notices any recurrence of the keloids to come back and I will do my best to inject it with steroids to try to stop the progression.  All her questions were answered.  She will follow-up on an as-needed basis.  12/04/2019, 8:52 AM

## 2020-01-22 ENCOUNTER — Other Ambulatory Visit: Payer: Self-pay

## 2020-01-22 ENCOUNTER — Ambulatory Visit (INDEPENDENT_AMBULATORY_CARE_PROVIDER_SITE_OTHER): Payer: BC Managed Care – PPO | Admitting: Plastic Surgery

## 2020-01-22 ENCOUNTER — Encounter: Payer: Self-pay | Admitting: Plastic Surgery

## 2020-01-22 VITALS — BP 104/68 | HR 74 | Temp 98.2°F

## 2020-01-22 DIAGNOSIS — L910 Hypertrophic scar: Secondary | ICD-10-CM | POA: Diagnosis not present

## 2020-01-22 NOTE — Progress Notes (Signed)
   Referring Provider No referring provider defined for this encounter.   CC:  Chief Complaint  Patient presents with  . Follow-up      Theresa Berger is an 29 y.o. female.  HPI: Patient presents after excision of keloids from her right shoulder and umbilical area.  She is pretty happy with the umbilical area.  She would like me to look at the 1 on the right shoulder because she feels like it is raising up again.  It also hurts a little bit on either into the scar.  Review of Systems General: Denies fevers or chills  Physical Exam Vitals with BMI 01/22/2020 12/04/2019 11/20/2019  Height - - 5\' 6"   Weight - - 154 lbs  BMI - - 24.87  Systolic 104 105  Diastolic 68 67 80  Pulse 74 78 65    General:  No acute distress,  Alert and oriented, Non-Toxic, Normal speech and affect Examination of the right shoulder shows an enlarging scar after excision of keloid in that area.  There may be mild hypertrophy of the scar in the umbilicus area but it is nowhere near what it was before.  Assessment/Plan We discussed the potential for keloid recurrence in the right shoulder.  We had discussed preexcision at this would was a risk factor.  Particularly because of the location on the posterior aspect of her shoulder.  We discussed the pros and cons of steroid injection and she wanted to proceed.  1.5 cc of Kenalog 40 was mixed with 3 cc of lidocaine with epinephrine.  This was distributed throughout the scar.  She taught tolerated this well.  I instructed her to give this 6 to 8 weeks to take effect and see how it goes.  If she does want an additional injection in either the shoulder or abdomen I am happy to do that for her after 8 weeks has passed since this injection.  All of her questions were answered.  756 01/22/2020, 9:15 AM

## 2020-03-05 ENCOUNTER — Encounter: Payer: Self-pay | Admitting: Plastic Surgery

## 2020-03-05 ENCOUNTER — Other Ambulatory Visit: Payer: Self-pay

## 2020-03-05 ENCOUNTER — Ambulatory Visit (INDEPENDENT_AMBULATORY_CARE_PROVIDER_SITE_OTHER): Payer: BC Managed Care – PPO | Admitting: Plastic Surgery

## 2020-03-05 VITALS — BP 98/67 | HR 73 | Temp 98.4°F

## 2020-03-05 DIAGNOSIS — L910 Hypertrophic scar: Secondary | ICD-10-CM | POA: Diagnosis not present

## 2020-03-05 NOTE — Progress Notes (Signed)
   Referring Provider No referring provider defined for this encounter.   CC:  Chief Complaint  Patient presents with  . Procedure      Theresa Berger is an 29 y.o. female.  HPI: Patient presents in follow-up for keloid on her right shoulder in her infraumbilical area.  I had excised these and there has been some recurrence in both areas.  This shoulder bothers her more than the infraumbilical area.  She wants a additional steroid injection in both spots.  Review of Systems General: Denies fevers or chills  Physical Exam Vitals with BMI 03/05/2020 01/22/2020 12/04/2019  Height - - -  Weight - - -  BMI - - -  Systolic 98 104 105  Diastolic 67 68 67  Pulse 73 74 78    General:  No acute distress,  Alert and oriented, Non-Toxic, Normal speech and affect Examination shows keloid recurrence in the shoulder and in the infraumbilical area.  Neither is quite as prominent as it was before.  There are also keloids that have developed along the suture holes.  I think there is a slight improvement in the shoulder since her last injection in terms of the color and firmness but the improvement is fairly subtle.  Assessment/Plan Patient has recurrent keloids in the infraumbilical and right shoulder area.  We discussed steroid injection.  The risks and benefits were discussed and she wanted to proceed with an additional injection.  Both areas were prepped with an alcohol pad and a mixture of 1.5 cc of Kenalog 40 and 3 cc of lidocaine with epinephrine was injected and distributed in both areas.  She tolerated this fine.  She is going to call us in another 6 to 8 weeks if she wants to have another injection.  All her questions were answered.  Theresa Berger 03/05/2020, 10:10 AM

## 2020-05-07 ENCOUNTER — Encounter: Payer: Self-pay | Admitting: Plastic Surgery

## 2020-05-07 ENCOUNTER — Other Ambulatory Visit: Payer: Self-pay

## 2020-05-07 ENCOUNTER — Ambulatory Visit: Payer: BC Managed Care – PPO | Admitting: Plastic Surgery

## 2020-05-07 VITALS — BP 110/69 | HR 90 | Temp 98.6°F

## 2020-05-07 DIAGNOSIS — L910 Hypertrophic scar: Secondary | ICD-10-CM

## 2020-05-07 NOTE — Progress Notes (Signed)
   Referring Provider No referring provider defined for this encounter.   CC:  Chief Complaint  Patient presents with  . Follow-up      Josi Roediger is an 29 y.o. female.  HPI: Patient presents for follow-up and evaluation of keloids.  I previously excised these but they have recurred.  She has 1 on the right shoulder and 1 in the umbilicus area.  I injected these over 6 weeks ago with steroids and there may have been a little bit of improvement but not a lot and she wants to try again.  Review of Systems General: Denies fevers or chills  Physical Exam Vitals with BMI 05/07/2020 03/05/2020 01/22/2020  Height - - -  Weight - - -  BMI - - -  Systolic 110 98 104  Diastolic 69 67 68  Pulse 90 73 74    General:  No acute distress,  Alert and oriented, Non-Toxic, Normal speech and affect Examination shows keloid in the right shoulder in the umbilical area largely unchanged.  There are a couple areas that are softer and less pigmented as a result of the steroid injection.  Both areas were cleaned with an alcohol pad.  A mixture of 2 cc of Kenalog 40 mixed with 3 cc of lidocaine was distributed amongst the areas.  She tolerated this well.  Assessment/Plan Patient has some persistent keloids in the right shoulder in the umbilical area.  I have asked her to return them 6 weeks or so to reevaluate these and I can inject them again if she would like.  Hopefully she gets a bit of a benefit from the injections.  All of her questions were answered.  [name] 05/07/2020, 2:45 PM

## 2020-07-08 ENCOUNTER — Encounter: Payer: Self-pay | Admitting: Plastic Surgery

## 2020-07-08 ENCOUNTER — Ambulatory Visit (INDEPENDENT_AMBULATORY_CARE_PROVIDER_SITE_OTHER): Payer: BC Managed Care – PPO | Admitting: Plastic Surgery

## 2020-07-08 ENCOUNTER — Other Ambulatory Visit: Payer: Self-pay

## 2020-07-08 VITALS — BP 135/85 | HR 101

## 2020-07-08 DIAGNOSIS — L910 Hypertrophic scar: Secondary | ICD-10-CM

## 2020-07-08 NOTE — Progress Notes (Signed)
   Referring Provider No referring provider defined for this encounter.   CC:  Chief Complaint  Patient presents with  . Follow-up      Theresa Berger is an 30 y.o. female.  HPI: Patient presents for management of keloids.  She has 1 on her umbilicus and one on her shoulder.  I previously excised these but they have recurred.  She also is bothered by one on the posterior aspect of her left earlobe.  She is generally happy with the way the steroids have managed the one around her umbilicus but would like some more steroid injection in the 1 on the posterior aspect of her right shoulder and would like a little bit of steroid in the one behind her left ear.  She feels like the right shoulder keloid has improved quite a bit since her last visit but she is interested in additional improvement.  Review of Systems General: Denies fevers or chills  Physical Exam Vitals with BMI 07/08/2020 05/07/2020 03/05/2020  Height - - -  Weight - - -  BMI - - -  Systolic 135 110 98  Diastolic 85 69 67  Pulse 101 90 73    General:  No acute distress,  Alert and oriented, Non-Toxic, Normal speech and affect Examination shows a keloid in the right shoulder that is flatter and smoother than previously.  There is a slight amount of skin hypopigmentation inferiorly but is not too noticeable.  She also has a 1 cm area of nodularity behind the left earlobe adjacent to a piercing that feels like a keloid.  Assessment/Plan We discussed the pros and cons of additional keloid steroid injection she is interested in proceeding.  The right shoulder was prepped with an alcohol pad and 2 cc of Kenalog 40 mixed with 1 cc of lidocaine with epinephrine was injected.  The left ear keloid was prepped with an alcohol pad and injected with 0.5 cc of Kenalog 40 and 0.5 cc of lidocaine mixed together.  She tolerated this fine.  We will see her again depending on how she feels going forward.  All her questions were answered.  [name] 07/08/2020, 10:51 AM

## 2020-07-15 ENCOUNTER — Other Ambulatory Visit: Payer: Self-pay | Admitting: Family Medicine

## 2020-07-15 DIAGNOSIS — Z3169 Encounter for other general counseling and advice on procreation: Secondary | ICD-10-CM

## 2020-07-15 MED ORDER — LETROZOLE 2.5 MG PO TABS: 2.5000 mg | ORAL_TABLET | Freq: Every day | ORAL | 1 refills | Status: AC

## 2020-09-17 ENCOUNTER — Ambulatory Visit (INDEPENDENT_AMBULATORY_CARE_PROVIDER_SITE_OTHER): Payer: Self-pay | Admitting: Plastic Surgery

## 2020-09-17 ENCOUNTER — Other Ambulatory Visit: Payer: Self-pay

## 2020-09-17 VITALS — BP 122/89 | HR 86

## 2020-09-17 DIAGNOSIS — L910 Hypertrophic scar: Secondary | ICD-10-CM

## 2020-09-17 NOTE — Progress Notes (Signed)
   Referring Provider No referring provider defined for this encounter.   CC:  Chief Complaint  Patient presents with  . Follow-up      Theresa Berger is an 30 y.o. female.  HPI: Patient presents for follow-up on her right shoulder and periumbilical keloids.  I excised both of these and they recurred.  Have been injecting them ever since.  Her last injection was over 2 months ago and she is interested in having more.  She does feel like both areas are improving particularly the right shoulder.  Review of Systems General: Denies fevers or chills  Physical Exam Vitals with BMI 09/17/2020 07/08/2020 05/07/2020  Height - - -  Weight - - -  BMI - - -  Systolic 122 135 673  Diastolic 89 85 69  Pulse 86 101 90    General:  No acute distress,  Alert and oriented, Non-Toxic, Normal speech and affect Examination shows softened appearance of the keloid on the right shoulder.  Is quite a bit flatter than it initially was with a few persistent prominent areas.  The infraumbilical keloid is a bit larger on the left side than the right and is thicker and more prominent on that side.  Assessment/Plan Patient presents with symptomatic keloids in the inferior umbilicus area in the right shoulder.  We discussed the risks and benefits of steroid injection.  She is interested in moving forward.  3 cc of Kenalog 40 and 3 cc of lidocaine with epinephrine were mixed together.  They were injected into both keloids without any issue.  She tolerated this well.  If she is interested in another injection we will schedule that around 2 months from now at the earliest.  Theresa Berger 09/17/2020, 3:07 PM

## 2020-11-04 ENCOUNTER — Ambulatory Visit: Payer: Self-pay | Admitting: Plastic Surgery

## 2020-11-24 IMAGING — US US PELVIS COMPLETE
1 series · 13 of 25 positions shown · non-contrast
Comparison: None

CLINICAL DATA: Pelvic pain for 6 weeks, amenorrhea

EXAM:
TRANSABDOMINAL AND TRANSVAGINAL ULTRASOUND OF PELVIS
DOPPLER ULTRASOUND OF OVARIES
TECHNIQUE: Both transabdominal and transvaginal ultrasound examinations of the
pelvis were performed. Transabdominal technique was performed for
global imaging of the pelvis including uterus, ovaries, adnexal
regions, and pelvic cul-de-sac.
It was necessary to proceed with endovaginal exam following the
transabdominal exam to visualize the endometrium. Color and duplex
Doppler ultrasound was utilized to evaluate blood flow to the
ovaries.

[Series 1: us pelvis complete · 13 of 90 slices shown]
[im 1/90]
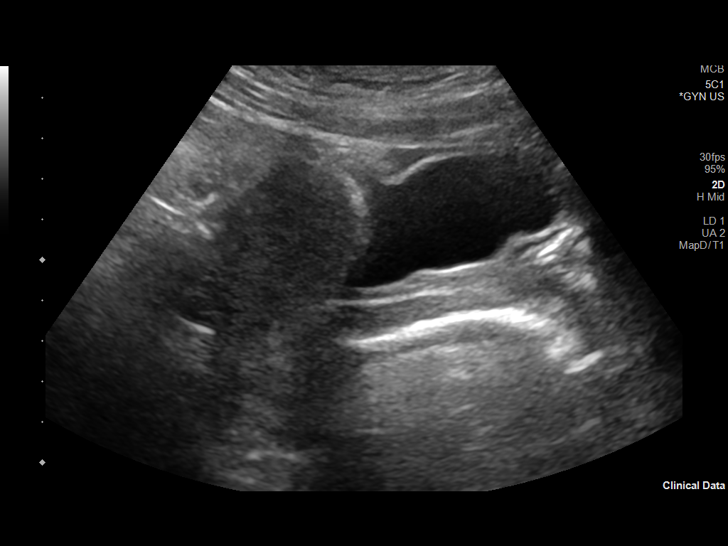
[im 8/90]
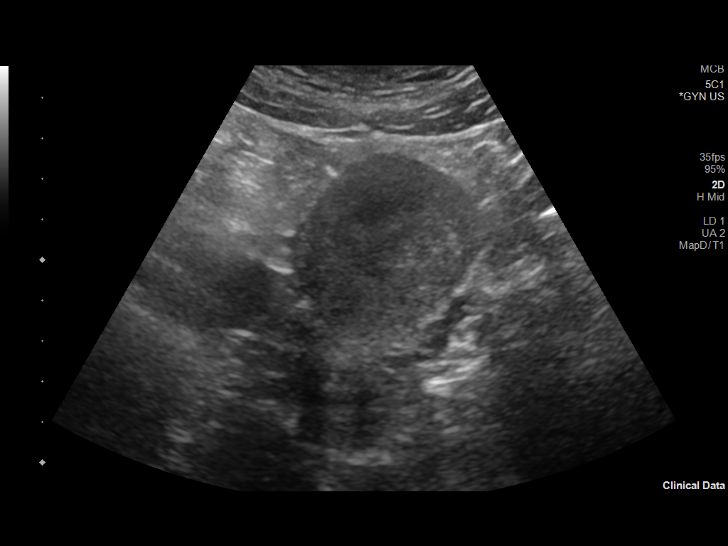
[im 15/90]
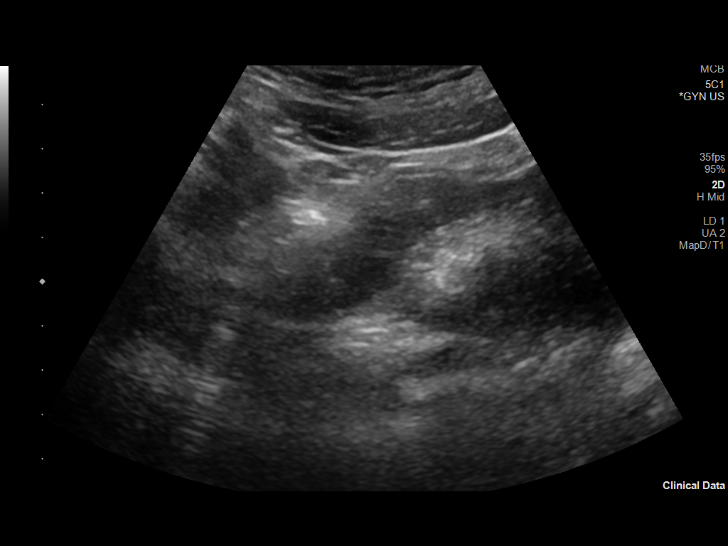
[im 23/90]
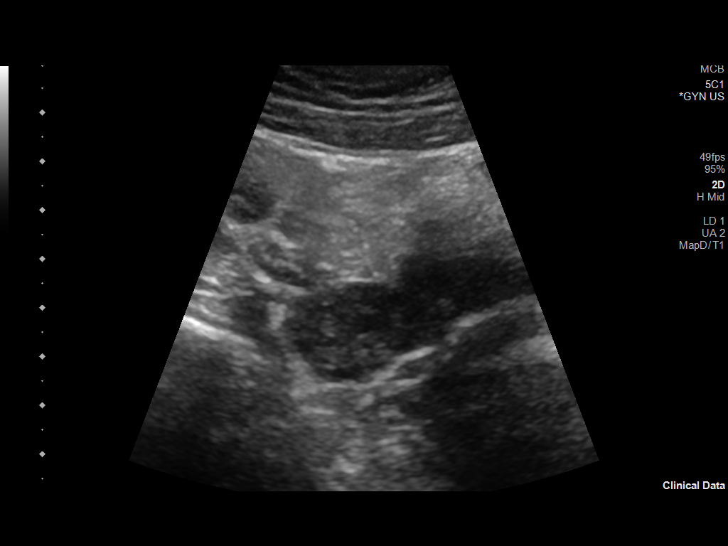
[im 30/90]
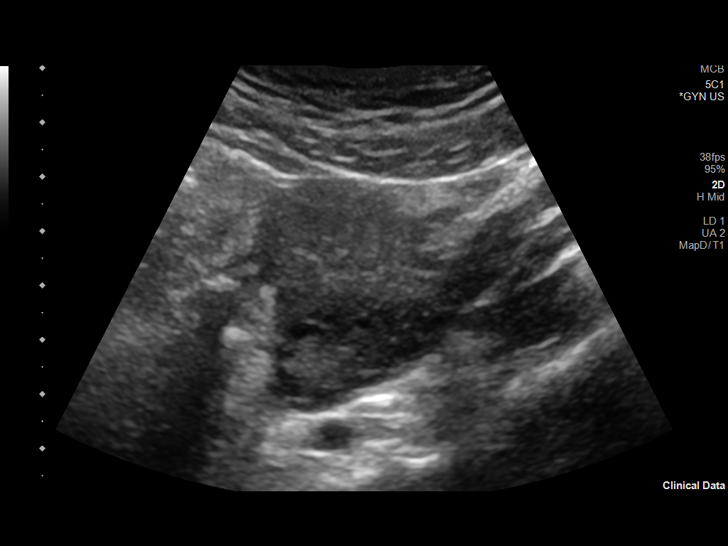
[im 38/90]
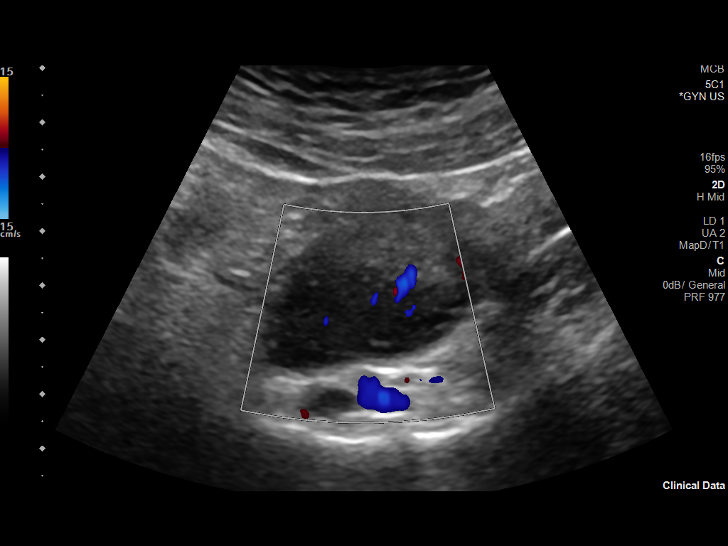
[im 45/90]
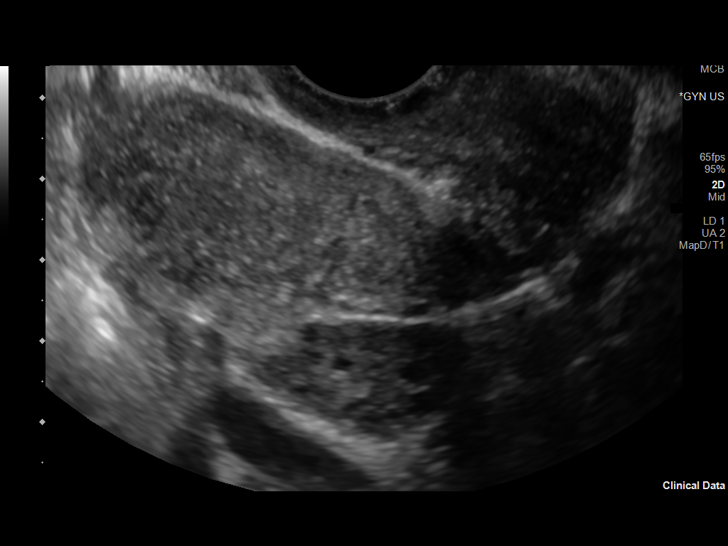
[im 52/90]
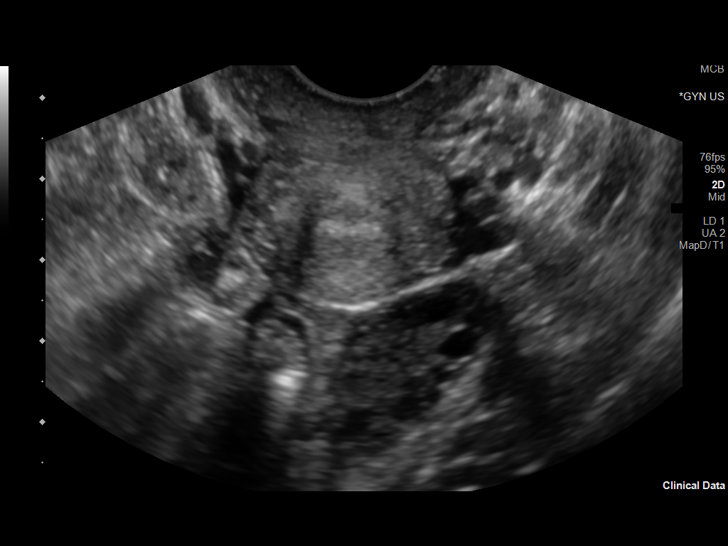
[im 60/90]
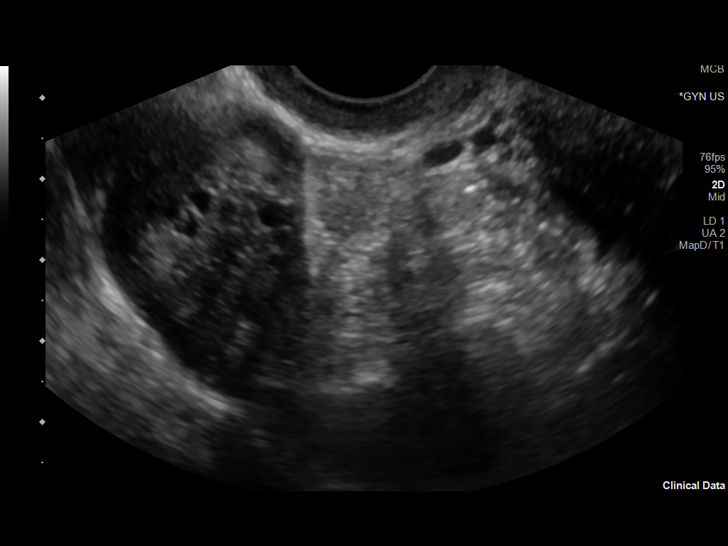
[im 67/90]
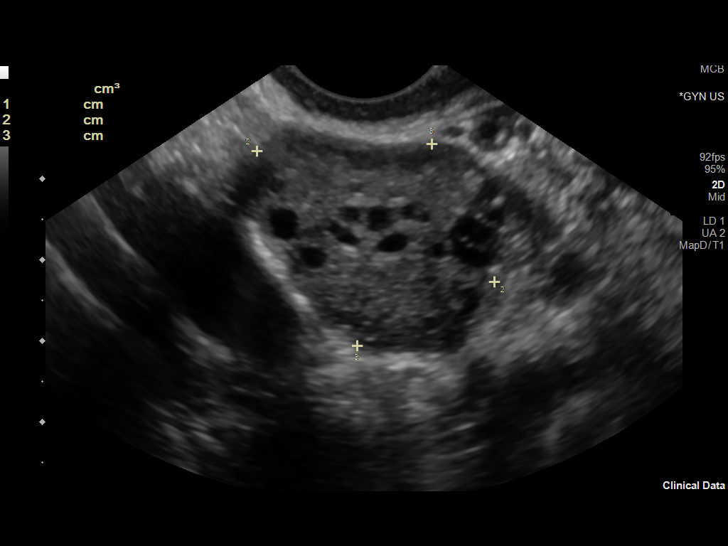
[im 75/90]
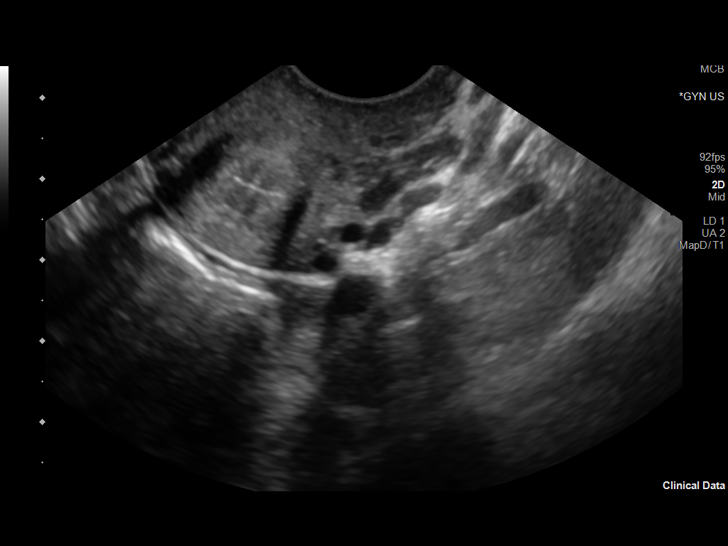
[im 82/90]
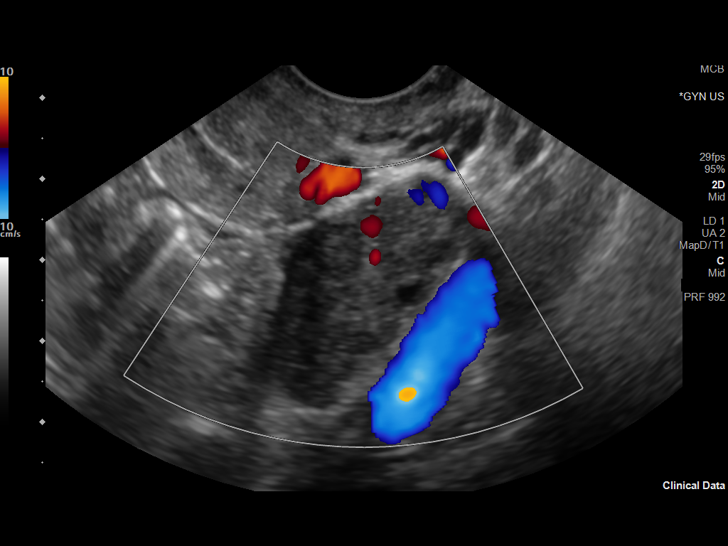
[im 90/90]
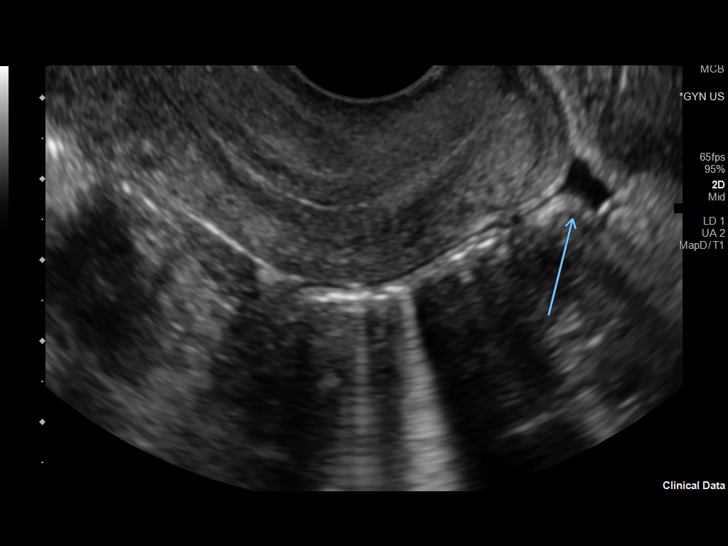

[13 of 25 positions shown; findings below may reference images not displayed]

FINDINGS: Uterus

Measurements: 7.4 x 3.8 x 4.5 cm = volume: 67 mL. Normal morphology
without mass

Endometrium

Thickness: 2 mm, normal. Normal thin appearance without endometrial
fluid or focal abnormality

Right ovary

Measurements: 3.4 x 2.7 x 2.8 cm = volume: 13.3 mL. Normal
morphology without mass period internal blood flow present on color
Doppler imaging.

Left ovary

Measurements: 3.1 x 2.0 x 1.7 cm = volume: 5.6 mL. Normal morphology
without mass period internal blood flow present on color Doppler
imaging.

Pulsed Doppler evaluation of both ovaries demonstrates normal
low-resistance arterial and venous waveforms. No evidence of ovarian
torsion.

Other findings

Trace free pelvic fluid.  No adnexal masses.
IMPRESSION: Normal exam.

## 2020-11-26 ENCOUNTER — Ambulatory Visit: Payer: Self-pay | Admitting: Plastic Surgery

## 2021-03-25 ENCOUNTER — Other Ambulatory Visit: Payer: Self-pay

## 2021-03-25 ENCOUNTER — Ambulatory Visit (INDEPENDENT_AMBULATORY_CARE_PROVIDER_SITE_OTHER): Payer: BC Managed Care – PPO | Admitting: Plastic Surgery

## 2021-03-25 DIAGNOSIS — L910 Hypertrophic scar: Secondary | ICD-10-CM

## 2021-03-25 NOTE — Progress Notes (Signed)
   Referring Provider No referring provider defined for this encounter.   CC:  Chief Complaint  Patient presents with   Follow-up      Theresa Berger is an 30 y.o. female.  HPI: Patient presents  Review of Systems General: Patient presents for follow-up regarding keloids in her right shoulder and inferior umbilical areas.  We have done a number of steroid injections focusing on the right shoulder she has had quite a bit of progress in that area.  She feels like the 1 below her umbilicus is growing at this point she would like an injection there.  Physical Exam Vitals with BMI 09/17/2020 07/08/2020 05/07/2020  Height - - -  Weight - - -  BMI - - -  Systolic 122 135 967  Diastolic 89 85 69  Pulse 86 101 90    General:  No acute distress,  Alert and oriented, Non-Toxic, Normal speech and affect On examination she has had quite a bit of flattening and fading of the right shoulder keloid.  There is 1 or 2 spots that are still prominent but otherwise there is been a dramatic improvement in this area.  For the umbilicus there looks to be some worsening of the keloid towards her left side compared to previous exams.  Assessment/Plan Patient has keloids in both the umbilical and right shoulder area.  We discussed repeat steroid injection.  2 cc of Kenalog 40 was diluted in around 5 cc of lidocaine with epinephrine.  This was distributed between the 2 sites after prepping with an alcohol pad.  She tolerated this fine.  We will plan to see her again in 6 weeks for reevaluation.  All of her questions were answered.  [name] 03/25/2021, 1:20 PM

## 2021-04-08 ENCOUNTER — Ambulatory Visit: Payer: Self-pay | Admitting: Plastic Surgery

## 2021-05-06 ENCOUNTER — Other Ambulatory Visit: Payer: Self-pay

## 2021-05-06 ENCOUNTER — Encounter: Payer: Self-pay | Admitting: Plastic Surgery

## 2021-05-06 ENCOUNTER — Ambulatory Visit (INDEPENDENT_AMBULATORY_CARE_PROVIDER_SITE_OTHER): Payer: BC Managed Care – PPO | Admitting: Plastic Surgery

## 2021-05-06 DIAGNOSIS — L910 Hypertrophic scar: Secondary | ICD-10-CM | POA: Diagnosis not present

## 2021-05-06 NOTE — Progress Notes (Signed)
   Referring Provider No referring provider defined for this encounter.   CC:  Chief Complaint  Patient presents with   Follow-up      Theresa Berger is an 30 y.o. female.  HPI: Patient presents for follow-up regarding keloids on her right shoulder and umbilical area.  We did steroid injection 6 weeks ago.  She feels like they are continuing to help and would like some more.  Review of Systems General: Denies fevers and chills  Physical Exam Vitals with BMI 09/17/2020 07/08/2020 05/07/2020  Height - - -  Weight - - -  BMI - - -  Systolic 122 135 297  Diastolic 89 85 69  Pulse 86 101 90    General:  No acute distress,  Alert and oriented, Non-Toxic, Normal speech and affect Examination of the right shoulder keloid shows that it is flattened out nicely.  There is only 2 small areas of persistent hyperpigmentation.  On the umbilical keloid this has improved quite a bit and is still a bit fuller on the left side than the right but is still prominent there.  Assessment/Plan Patient desires more steroid injections.  We discussed the risks and benefits.  The areas were prepped and alcohol pad and I diluted 1 cc of Kenalog 40 and 2 cc of lidocaine with epinephrine.  A very small amount of this was injected into the shoulder and the remainder was injected into the umbilical keloid.  She tolerated this fine.  We will plan to see her again in 8 weeks and evaluate whether any more is needed.  I did explain to her that we would need to leave the shoulder alone for a while as the steroid effects on the skin in that area seem to be fairly complete and would not want to overdo it.  She is understanding.  The umbilical keloid might be reasonable to inject again in the future.  [name] 05/06/2021, 3:22 PM

## 2021-06-30 ENCOUNTER — Ambulatory Visit (INDEPENDENT_AMBULATORY_CARE_PROVIDER_SITE_OTHER): Payer: BC Managed Care – PPO | Admitting: Plastic Surgery

## 2021-06-30 ENCOUNTER — Other Ambulatory Visit: Payer: Self-pay

## 2021-06-30 DIAGNOSIS — L910 Hypertrophic scar: Secondary | ICD-10-CM

## 2021-06-30 NOTE — Progress Notes (Signed)
° °  Referring Provider No referring provider defined for this encounter.   CC:  Chief Complaint  Patient presents with   Follow-up      Theresa Berger is an 31 y.o. female.  HPI: Patient presents in follow-up for keloid injections in the right shoulder and inferior umbilical areas.  She feels like she continues to make progress with the steroid injections.  She would like more in both areas today if possible.  Review of Systems General: Denies fevers or chills  Physical Exam Vitals with BMI 09/17/2020 07/08/2020 05/07/2020  Height - - -  Weight - - -  BMI - - -  Systolic 122 135 923  Diastolic 89 85 69  Pulse 86 101 90    General:  No acute distress,  Alert and oriented, Non-Toxic, Normal speech and affect Examination shows slow but steady improvement in both areas.  In the right shoulder there is a little bit of white appearing deposits beneath the skin and 1 area which would appear to be steroid deposits.  Otherwise the shoulder keloid is almost completely flat with a few small areas of hyperpigmentation.  The umbilicus keloid is flatter and softer than previously.  Assessment/Plan I recommended that she avoid additional injection in the shoulder at this point.  The skin is getting pretty thin and overall there may be more risk than reward to further injections.  I do think some additional injections could be done in the infraumbilical his keloid which is more on the left lateral aspect of it.  That area was prepped with an alcohol pad and 1 cc of Kenalog 40 was diluted in 2 cc of lidocaine and this was injected without any issue.  We will plan to see her again in a couple months for her next visit.  Theresa Berger 06/30/2021, 2:04 PM

## 2021-12-09 ENCOUNTER — Ambulatory Visit (INDEPENDENT_AMBULATORY_CARE_PROVIDER_SITE_OTHER): Payer: BC Managed Care – PPO | Admitting: Plastic Surgery

## 2021-12-09 DIAGNOSIS — L910 Hypertrophic scar: Secondary | ICD-10-CM | POA: Diagnosis not present

## 2021-12-09 NOTE — Progress Notes (Signed)
   Referring Provider No referring provider defined for this encounter.   CC:  Chief Complaint  Patient presents with   Follow-up      Theresa Berger is an 31 y.o. female.  HPI: Patient presents for follow-up regarding her right shoulder keloid.  I have injected this a number of times.  At her last visit with me 6 months ago she was interested in additional injections but I wanted to hold off given the number of times we injected in the past.  She feels like there is some areas that are growing thicker around the borders and she would like to get it before it worsens.  Regarding the abdominal keloid she feels that this improved significantly based on the last injection and is happy with that at this point.  Review of Systems General: Denies fevers and chills  Physical Exam    09/17/2020    1:26 PM 07/08/2020    8:40 AM 05/07/2020    1:19 PM  Vitals with BMI  Systolic 122 135 024  Diastolic 89 85 69  Pulse 86 101 90    General:  No acute distress,  Alert and oriented, Non-Toxic, Normal speech and affect On examination she has a keloid in the right shoulder.  This is mostly flat but does have a few raised areas.  There is 1 area that feels atrophied with some white subcutaneous flecks consistent with prior steroid.  Overall this area looks healthier and more robust than at her last visit.  Assessment/Plan Patient presents with a keloid in the right shoulder.  We discussed injections versus continued observation.  Ultimately given the improvement in the appearance of the skin I think it would be reasonable to proceed with a steroid injection in that area.  2 cc of Kenalog 40 was mixed with 2 cc lidocaine with epinephrine and this was injected into the thicker areas of the scar.  She tolerated this fine.  We will plan to see at her next visit in 3 months.  All of her questions were answered.  [name] 12/09/2021, 2:07 PM

## 2022-03-10 ENCOUNTER — Encounter: Payer: Self-pay | Admitting: Surgical

## 2022-03-10 ENCOUNTER — Ambulatory Visit: Payer: BC Managed Care – PPO | Admitting: Plastic Surgery

## 2022-03-10 ENCOUNTER — Ambulatory Visit (INDEPENDENT_AMBULATORY_CARE_PROVIDER_SITE_OTHER): Payer: BC Managed Care – PPO | Admitting: Surgical

## 2022-03-10 DIAGNOSIS — L910 Hypertrophic scar: Secondary | ICD-10-CM

## 2022-03-10 NOTE — Progress Notes (Signed)
   Subjective:     Patient ID: Theresa Berger, female    DOB: December 01, 1990, 31 y.o.   MRN: [id number]  Chief Complaint  Patient presents with   Follow-up    HPI: The patient is a 31 y.o. female here for follow-up for evaluation of her right shoulder keloid.  She previously had the right shoulder keloid excised on 11/20/2019 with Dr. Arita Miss.  She then had multiple injections in this area over the past 2 years.  She was last seen in the office on 12/09/2021 for an injection of the right posterior shoulder, she presents today for additional evaluation and keloid injection.  Review of Systems  Constitutional: Negative.   Respiratory: Negative.    Skin:  Positive for color change. Negative for wound.     Objective:   Vital Signs There were no vitals taken for this visit. Vital Signs and Nursing Note Reviewed Chaperone present Physical Exam Constitutional:      General: She is not in acute distress.    Appearance: Normal appearance. She is not ill-appearing.  HENT:     Head: Normocephalic and atraumatic.  Skin:         Comments: Previous excision site of right posterior shoulder keloid noted.  There is some pigmentation changes, the skin appears quite thin in this area.  She does have 1 area of some hyper trophic scarring along the medial border noted that is approximately 3 x 3 mm and another area superior to this that is approximately 1.5 x 1.5 mm.  There is no erythema or cellulitic changes.  No tenderness with palpation noted.  Neurological:     Mental Status: She is alert.        Assessment/Plan:     ICD-10-CM   1. 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  She had this area injected approximately 9 times over the past 2 years.  She was interested in additional injection today, however I discussed with the patient that due to the overall  appearance of the area and the thinning of the skin I did not feel comfortable injecting the area today.  I did discuss with her that I would like to set up a telephone visit with her in 1 week to rediscuss after I consult with Dr. Ulice Bold or Dr. Domenica Reamer to obtain their insight and recommendations.  Patient was overall agreeable to this.  She knows to call with questions or concerns.  Pictures were obtained of the patient and placed in the chart with the patient's or guardian's permission.   Kermit Balo Meagen Limones, PA-C 03/10/2022, 12:19 PM

## 2022-03-14 ENCOUNTER — Ambulatory Visit (INDEPENDENT_AMBULATORY_CARE_PROVIDER_SITE_OTHER): Payer: BC Managed Care – PPO | Admitting: Surgical

## 2022-03-14 DIAGNOSIS — L910 Hypertrophic scar: Secondary | ICD-10-CM

## 2022-03-14 NOTE — Progress Notes (Signed)
Patient is a 32 year old female here virtually via telephone to discuss her right posterior shoulder keloid.  I saw her in the office on 03/10/2022 for steroid injection, however discussed with the patient that I did not feel comfortable injecting the area due to the appearance of the area and the skin thinning noted.  I discussed her case with Dr. Erin Hearing and he agreed with plan to not inject and to reevaluate.  I discussed this today with the patient, I recommend waiting approximately 2 to 3 months and reevaluating the area with Dr. Erin Hearing.  Patient reports that she mostly wants the medial area that is raised to be injected.   We discussed scheduling an appointment for November 30 at 1115, however after reviewing the schedule Dr. Erin Hearing will actually be in surgery this day.  I have notified the front office staff to please call the patient to reschedule an appointment around this time.  All of her questions were answered to her content.

## 2022-05-06 ENCOUNTER — Ambulatory Visit (INDEPENDENT_AMBULATORY_CARE_PROVIDER_SITE_OTHER): Payer: BC Managed Care – PPO | Admitting: Plastic Surgery

## 2022-05-06 ENCOUNTER — Encounter: Payer: Self-pay | Admitting: Plastic Surgery

## 2022-05-06 DIAGNOSIS — L910 Hypertrophic scar: Secondary | ICD-10-CM | POA: Diagnosis not present

## 2022-05-06 NOTE — Progress Notes (Signed)
Procedure: Steroid injection Indication: Keloid After discussing the procedure and the risks of skin atrophy and skin hypopigmentation with the patient I proceeded with injection of steroid into the right shoulder keloid at her request. I injected 0.6 mL of a 50-50 mix of Kenalog 40 and 1% lidocaine. The patient tolerated the procedure well there were no complications.  She was given instructions to massage the injected area daily for the next 6 weeks. She may return as needed.

## 2022-05-06 NOTE — Progress Notes (Signed)
   Referring Provider No referring provider defined for this encounter.   CC:  Chief Complaint  Patient presents with   Follow-up      Theresa Berger is an 31 y.o. female.  HPI: Theresa Berger is well-known to the clinic.  She was treated for keloid on her right shoulder over a year ago with surgical excision.  Recurrence of the keloid has been undergoing injections of steroids since.  Most of the keloid has resolved however she has been left with a very thin friable skin.  She still has 1 small area of keloid that she would like to have treated with steroids.  No Known Allergies  Outpatient Encounter Medications as of 05/06/2022  Medication Sig   Prenat w/o A Vit-FeFum-FePo-FA (CONCEPT OB) 130-92.4-1 MG CAPS Take 1 capsule by mouth daily.   letrozole (FEMARA) 2.5 MG tablet Take 1 tablet (2.5 mg total) by mouth daily. Take on days 3-7   medroxyPROGESTERone (PROVERA) 5 MG tablet TAKE 1 TABLET (5 MG TOTAL) BY MOUTH DAILY. TAKE FOR 5 DAYS TO INDUCE A CYCLE   No facility-administered encounter medications on file as of 05/06/2022.     No past medical history on file.  Past Surgical History:  Procedure Laterality Date   LAPAROSCOPIC APPENDECTOMY N/A 02/15/2016   Procedure: APPENDECTOMY LAPAROSCOPIC;  Surgeon: Gaynelle Adu, MD;  Location: [facility] OR;  Service: General;  Laterality: N/A;    No family history on file.  Social History   Social History Narrative   Not on file     Review of Systems General: Denies fevers, chills, weight loss CV: Denies chest pain, shortness of breath, palpitations Skin: Mostly resolved keloid right shoulder  Physical Exam    09/17/2020    1:26 PM 07/08/2020    8:40 AM 05/07/2020    1:19 PM  Vitals with BMI  Systolic 122 135 045  Diastolic 89 85 69  Pulse 86 101 90    General:  No acute distress,  Alert and oriented, Non-Toxic, Normal speech and affect Integument: Right shoulder shows a defect with thin friable skin and atrophy of the  subcutaneous tissues.  There is a area of residual hardness, probably a keloid, approximately 1 x 1 cm. Mammogram: Not applicable Assessment/Plan Keloid: I am very concerned about the level of atrophy in the skin and subcutaneous tissues from her multiple injections of steroids.  However she does have a small amount of residual keloid.  I have agreed to inject this 1 time.  She understands that there is risk of skin breakdown and that the residual keloid may not resolve.  She asked that I proceed with injecting the keloid anyways.  Santiago Glad 05/06/2022, 2:38 PM

## 2022-05-26 ENCOUNTER — Ambulatory Visit: Payer: BC Managed Care – PPO | Admitting: Plastic Surgery
